# Patient Record
Sex: Male | Born: 1970
Health system: Southern US, Community
[De-identification: ages and names within clinical notes are randomized; demographics above are authoritative.]

## PROBLEM LIST (undated history)

## (undated) DIAGNOSIS — I499 Cardiac arrhythmia, unspecified: Secondary | ICD-10-CM

## (undated) DIAGNOSIS — K219 Gastro-esophageal reflux disease without esophagitis: Secondary | ICD-10-CM

## (undated) DIAGNOSIS — C439 Malignant melanoma of skin, unspecified: Secondary | ICD-10-CM

## (undated) HISTORY — DX: Malignant melanoma of skin, unspecified: C43.9

## (undated) HISTORY — DX: Gastro-esophageal reflux disease without esophagitis: K21.9

## (undated) HISTORY — PX: SHOULDER SURGERY: SHX246

---
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
## 2002-07-14 HISTORY — PX: SHOULDER SURGERY: SHX246

## 2004-12-23 ENCOUNTER — Ambulatory Visit: Payer: Self-pay

## 2006-10-15 IMAGING — CT CT HEAD WITHOUT CONTRAST
3 of 4 series · 17 of 30 positions shown, 19 images · non-contrast
Comparison: none

REASON FOR EXAM: pain S/P head injury   CALL REPORT TO [phone]
COMMENTS:

[Series 2: without · axial · non-contrast · 0.40mm/px · z∈[-54,+46]mm · 5 of 30 slices shown (1 of 2)]
[im 5/30  brain]
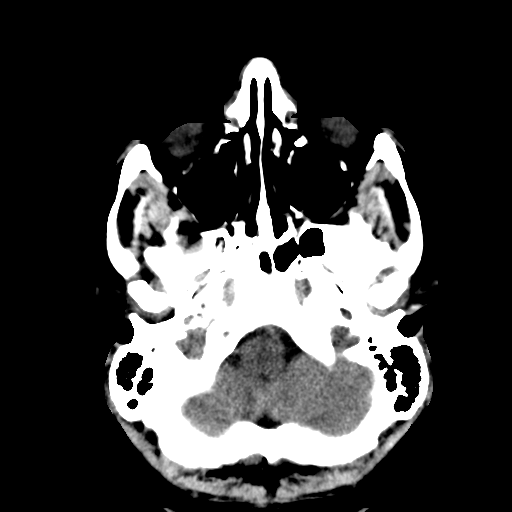
[im 10/30  brain]
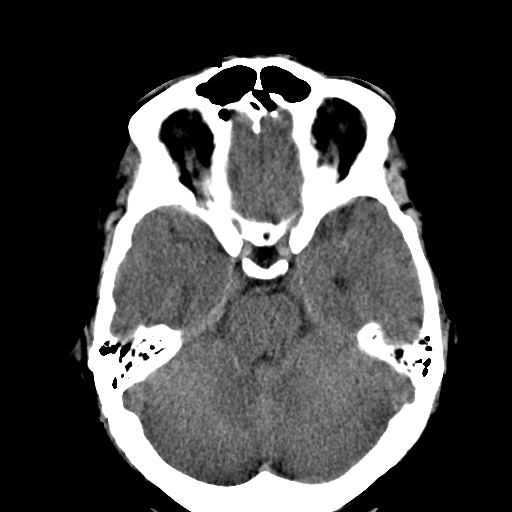
[im 15/30  brain]
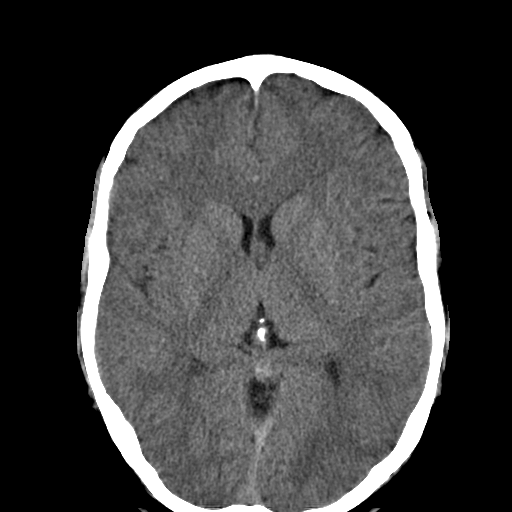
[im 20/30  brain]
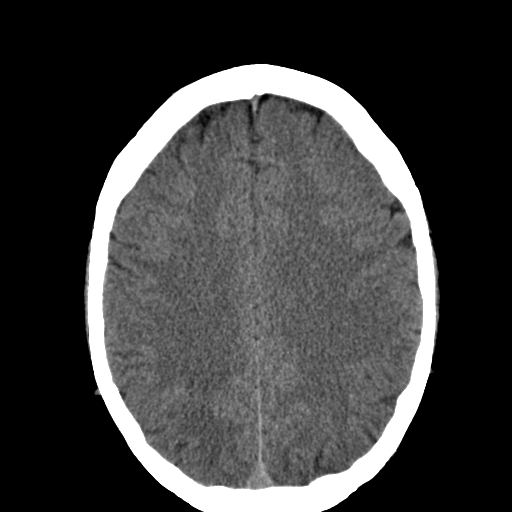
[im 25/30  brain]
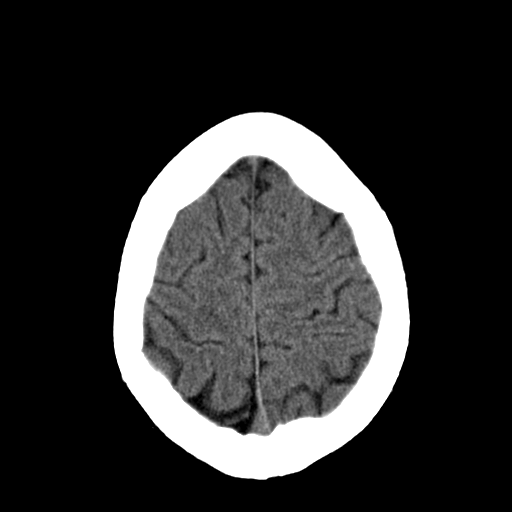

[Series 4: without · axial · non-contrast · 0.43mm/px · z∈[-54,+46]mm · 6 of 30 slices shown, 8 images (2 of 2)]
[im 5/30  brain]
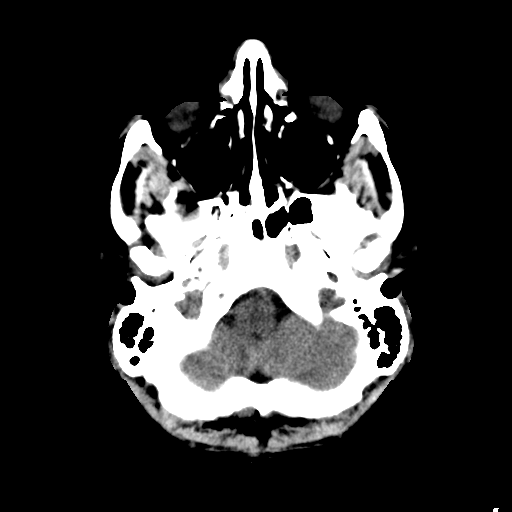
[im 5/30  bone]
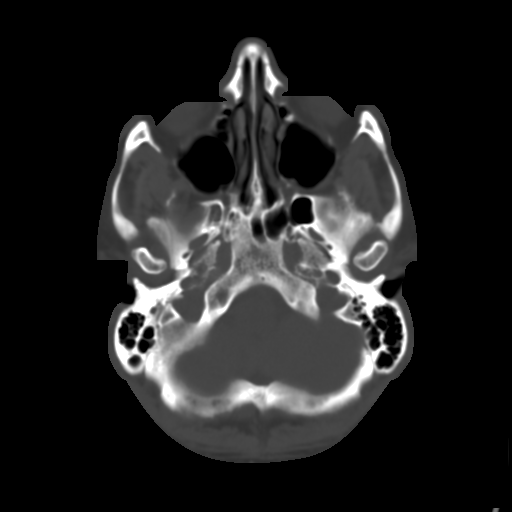
[im 9/30  brain]
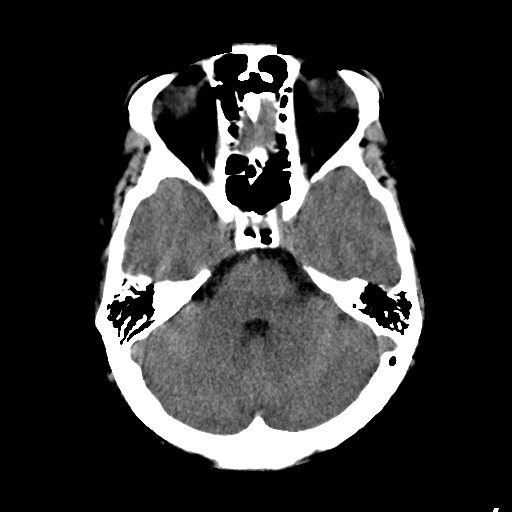
[im 13/30  brain]
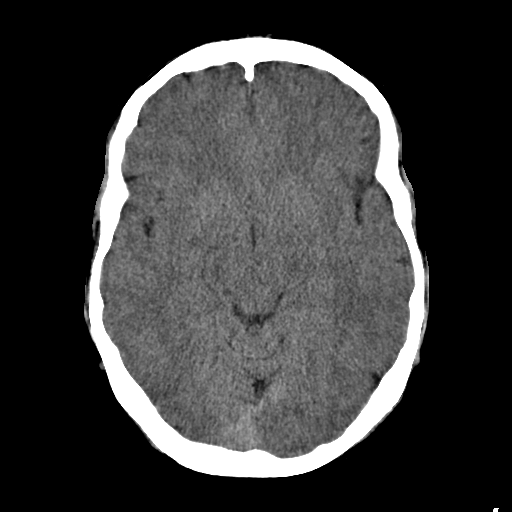
[im 17/30  brain]
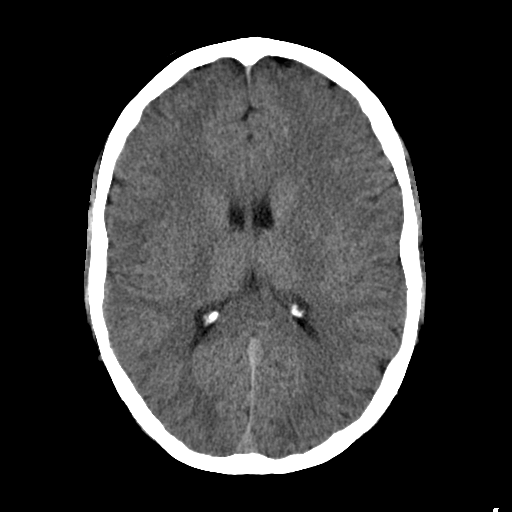
[im 21/30  brain]
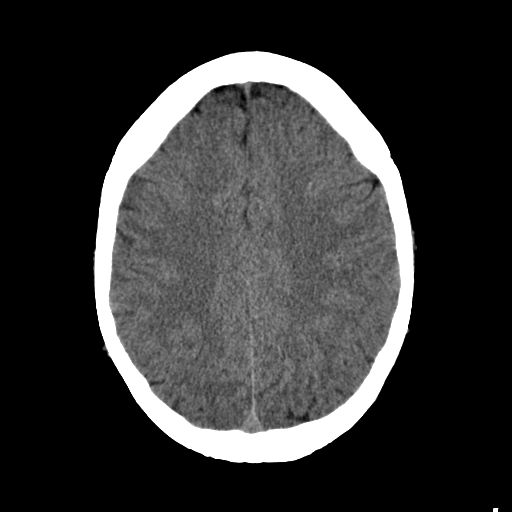
[im 21/30  bone]
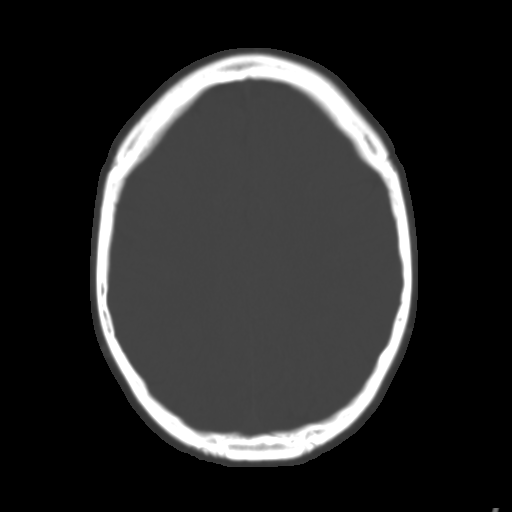
[im 25/30  brain]
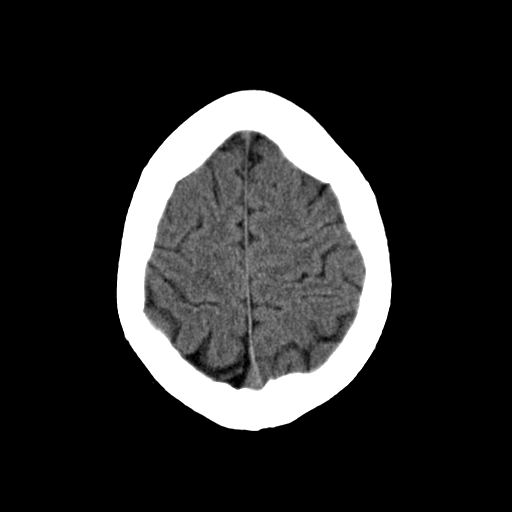

[Series 5: bone windows · axial · 0.44mm/px · z∈[-54,+46]mm · 6 of 30 slices shown]
[im 5/30  bone]
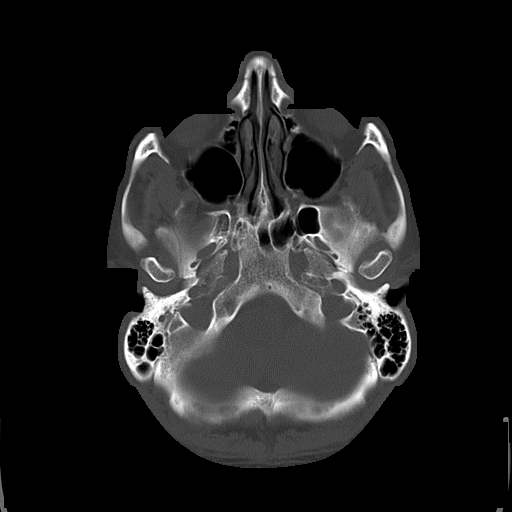
[im 9/30  bone]
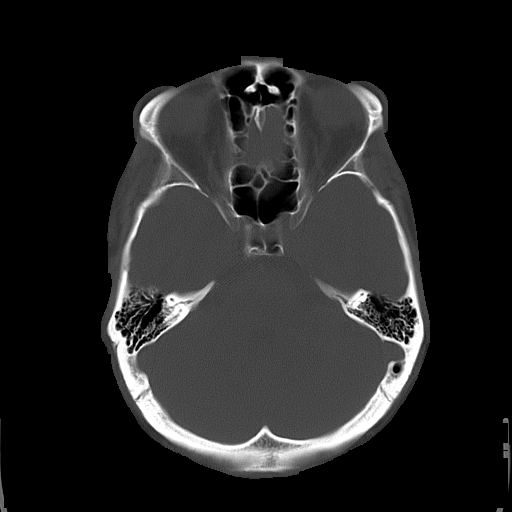
[im 13/30  bone]
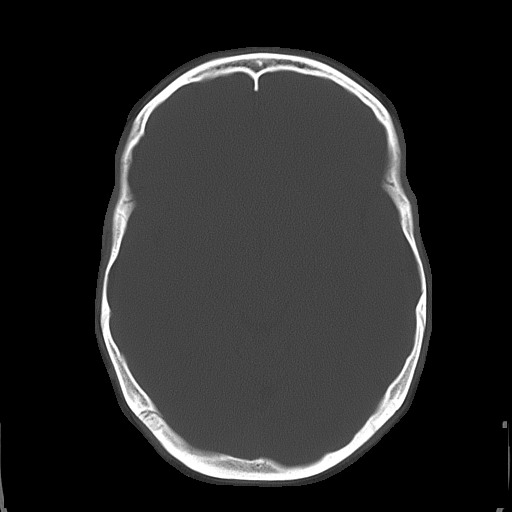
[im 17/30  bone]
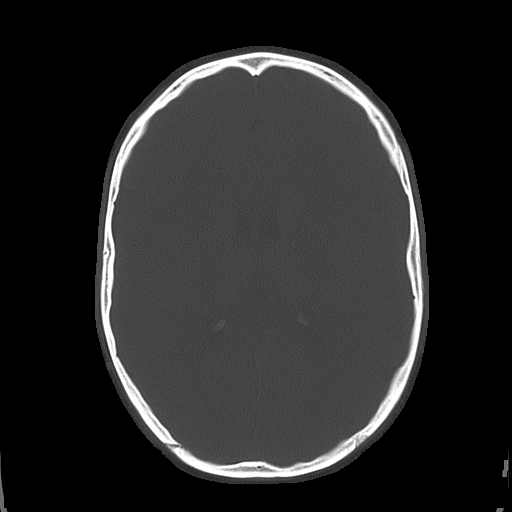
[im 21/30  bone]
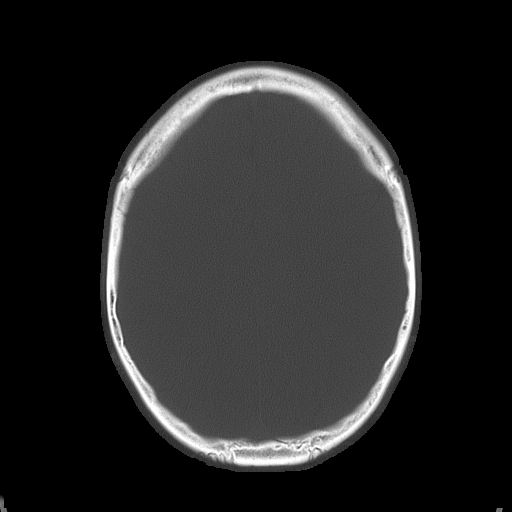
[im 25/30  bone]
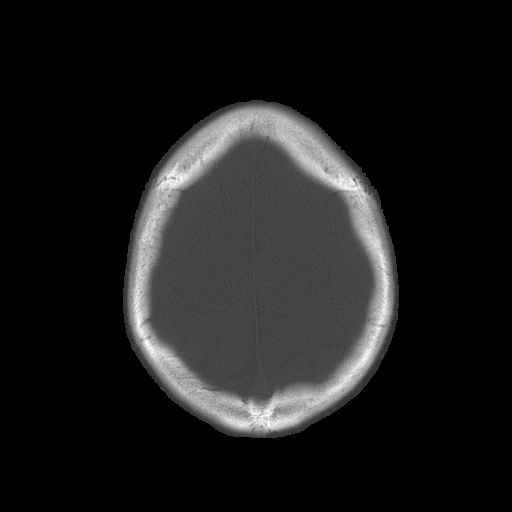

[17 of 30 positions shown; findings below may reference images not displayed]

PROCEDURE:     CT  - CT HEAD WITHOUT CONTRAST  - December 23, 2004 [DATE]

RESULT:       Noncontrast emergent CT scan of the brain shows the bone
windows are fully intact with no evidence of fracture.  There is an area of
low density in the posterior high RIGHT parietal region which suggest either
an area of encephalomalacia from previous injury or trauma or hypoxia.  A
migrational abnormality which may be an incidental finding could cause a
similar appearance. The brain, otherwise appears to be normal.   The
ventricles and sulci are maintained.  The visualized paranasal sinuses are
clear.
IMPRESSION: 1.     No acute intracranial abnormality.
2.     Possible incidental finding of a small area of encephalomalacia
versus minimal migration abnormality which is of doubtful clinical
significance.

## 2011-02-12 ENCOUNTER — Encounter: Payer: Self-pay | Admitting: Podiatry

## 2011-02-12 DIAGNOSIS — B351 Tinea unguium: Secondary | ICD-10-CM

## 2012-06-13 ENCOUNTER — Emergency Department: Payer: Self-pay | Admitting: Emergency Medicine

## 2013-04-22 ENCOUNTER — Ambulatory Visit (INDEPENDENT_AMBULATORY_CARE_PROVIDER_SITE_OTHER): Payer: BC Managed Care – PPO | Admitting: Family Medicine

## 2013-04-22 ENCOUNTER — Encounter: Payer: Self-pay | Admitting: Family Medicine

## 2013-04-22 VITALS — BP 132/96 | HR 76 | Temp 98.3°F | Resp 18 | Ht 71.5 in | Wt 244.0 lb

## 2013-04-22 DIAGNOSIS — Z23 Encounter for immunization: Secondary | ICD-10-CM

## 2013-04-22 DIAGNOSIS — M25519 Pain in unspecified shoulder: Secondary | ICD-10-CM

## 2013-04-22 DIAGNOSIS — IMO0001 Reserved for inherently not codable concepts without codable children: Secondary | ICD-10-CM

## 2013-04-22 DIAGNOSIS — K219 Gastro-esophageal reflux disease without esophagitis: Secondary | ICD-10-CM

## 2013-04-22 DIAGNOSIS — R030 Elevated blood-pressure reading, without diagnosis of hypertension: Secondary | ICD-10-CM

## 2013-04-22 DIAGNOSIS — M25512 Pain in left shoulder: Secondary | ICD-10-CM

## 2013-04-22 DIAGNOSIS — E669 Obesity, unspecified: Secondary | ICD-10-CM

## 2013-04-22 DIAGNOSIS — I10 Essential (primary) hypertension: Secondary | ICD-10-CM | POA: Insufficient documentation

## 2013-04-22 MED ORDER — NAPROXEN 500 MG PO TABS: 500.0000 mg | ORAL_TABLET | Freq: Two times a day (BID) | ORAL | Status: DC

## 2013-04-22 MED ORDER — OMEPRAZOLE 40 MG PO CPDR: 40.0000 mg | DELAYED_RELEASE_CAPSULE | Freq: Every day | ORAL | Status: DC

## 2013-04-22 NOTE — Progress Notes (Signed)
  Subjective:    Patient ID: Raymond Hansen, male    DOB: [birth year], 42 y.o.   MRN: [id number]  HPI  Pt here to establish care, previous PCP [facility] [place]- Dr. Dora Sims. Wants to transfer as this is closer to his home. Medications and history reviewed. History of recurrent bursitis in his right shoulder, had steroid injections in the past was seen by ortho with minimal improvement. He received ROM but now has symptoms in his Left shoulder that are the same as his right. He has sharp pain that radiates from shoulder to biceps region. He feels he has lost ROM. Has taken a few doses of ibuprofen and uses ICE. Worse after doing any type of yard work or repetitive movements.  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 , repetitive motions, heavy lifting in the past GERD- avoids specific foods that trigger symptoms, used prevacid, zantac in the past, now omeprazole OTC which helps   HAD CPE 6 months ago with previous PCP with fasting labs, states all labs were normal  Review of Systems  GEN- denies fatigue, fever, weight loss,weakness, recent illness HEENT- denies eye drainage, change in vision, nasal discharge, CVS- denies chest pain, palpitations RESP- denies SOB, cough, wheeze ABD- denies N/V, change in stools, abd pain GU- denies dysuria, hematuria, dribbling, incontinence MSK- + joint pain, muscle aches, injury Neuro- denies headache, dizziness, syncope, seizure activity      Objective:   Physical Exam GEN- NAD, alert and oriented x3, repeat BP 132/92  HEENT- PERRL, EOMI, non injected sclera, pink conjunctiva, MMM, oropharynx clear, TM clear bilat Neck- Supple, good ROM CVS- RRR, no murmur RESP-CTAB MSK- Normal inspection shoulder, arms, +empty can on Left side, Decreased ROM Left shoulder, biceps /triceps in tact, no winged scapula  Neuro- normal tone UE, strength decreased some L compared to RUE EXT- No edema Pulses- Radial 2+        Assessment &  Plan:

## 2013-04-22 NOTE — Assessment & Plan Note (Addendum)
Decreased ROM, no specific injury, ? Bursitis/ rotator cuff strain or early OA based on history occupations Refer back to ortho Start naprosyn BID

## 2013-04-22 NOTE — Assessment & Plan Note (Signed)
Diastolic BP was elevated a little today, even on recheck, will have him monitor this at home Discussed exercise and diet

## 2013-04-22 NOTE — Patient Instructions (Signed)
Referral to orthopedics for the shoulder FLu shot given Naprosyn twice a day  Omeprazole once a day for acid reflux Release of records from [facility] your blood pressure -  F/U 6 months

## 2013-04-22 NOTE — Assessment & Plan Note (Signed)
Increase omeprazole to 40mg  in setting of NSAID use for shoulder Obtain records and labs from her previous PCP

## 2013-07-14 DIAGNOSIS — C439 Malignant melanoma of skin, unspecified: Secondary | ICD-10-CM

## 2013-07-14 HISTORY — DX: Malignant melanoma of skin, unspecified: C43.9

## 2013-10-31 ENCOUNTER — Ambulatory Visit (INDEPENDENT_AMBULATORY_CARE_PROVIDER_SITE_OTHER): Payer: BC Managed Care – PPO | Admitting: Family Medicine

## 2013-10-31 ENCOUNTER — Encounter: Payer: Self-pay | Admitting: Family Medicine

## 2013-10-31 VITALS — BP 132/88 | HR 82 | Temp 98.3°F | Resp 16 | Ht 73.0 in | Wt 253.0 lb

## 2013-10-31 DIAGNOSIS — E669 Obesity, unspecified: Secondary | ICD-10-CM

## 2013-10-31 DIAGNOSIS — Z0000 Encounter for general adult medical examination without abnormal findings: Secondary | ICD-10-CM

## 2013-10-31 DIAGNOSIS — K219 Gastro-esophageal reflux disease without esophagitis: Secondary | ICD-10-CM

## 2013-10-31 LAB — COMPREHENSIVE METABOLIC PANEL
ALK PHOS: 48 U/L (ref 39–117)
ALT: 44 U/L (ref 0–53)
AST: 32 U/L (ref 0–37)
Albumin: 4.4 g/dL (ref 3.5–5.2)
BILIRUBIN TOTAL: 0.7 mg/dL (ref 0.2–1.2)
BUN: 9 mg/dL (ref 6–23)
CO2: 27 mEq/L (ref 19–32)
CREATININE: 0.85 mg/dL (ref 0.50–1.35)
Calcium: 9.3 mg/dL (ref 8.4–10.5)
Chloride: 102 mEq/L (ref 96–112)
GLUCOSE: 102 mg/dL — AB (ref 70–99)
Potassium: 3.9 mEq/L (ref 3.5–5.3)
Sodium: 140 mEq/L (ref 135–145)
Total Protein: 6.8 g/dL (ref 6.0–8.3)

## 2013-10-31 LAB — CBC WITH DIFFERENTIAL/PLATELET
Basophils Absolute: 0.1 10*3/uL (ref 0.0–0.1)
Basophils Relative: 1 % (ref 0–1)
EOS ABS: 0.3 10*3/uL (ref 0.0–0.7)
EOS PCT: 5 % (ref 0–5)
HEMATOCRIT: 43.7 % (ref 39.0–52.0)
HEMOGLOBIN: 16.1 g/dL (ref 13.0–17.0)
LYMPHS ABS: 1.5 10*3/uL (ref 0.7–4.0)
Lymphocytes Relative: 26 % (ref 12–46)
MCH: 32.3 pg (ref 26.0–34.0)
MCHC: 36.8 g/dL — ABNORMAL HIGH (ref 30.0–36.0)
MCV: 87.8 fL (ref 78.0–100.0)
MONO ABS: 0.6 10*3/uL (ref 0.1–1.0)
MONOS PCT: 10 % (ref 3–12)
NEUTROS PCT: 58 % (ref 43–77)
Neutro Abs: 3.3 10*3/uL (ref 1.7–7.7)
Platelets: 208 10*3/uL (ref 150–400)
RBC: 4.98 MIL/uL (ref 4.22–5.81)
RDW: 13.7 % (ref 11.5–15.5)
WBC: 5.7 10*3/uL (ref 4.0–10.5)

## 2013-10-31 LAB — LIPID PANEL
CHOL/HDL RATIO: 4.9 ratio
CHOLESTEROL: 201 mg/dL — AB (ref 0–200)
HDL: 41 mg/dL (ref >39)
LDL Cholesterol: 98 mg/dL (ref 0–99)
Triglycerides: 310 mg/dL — ABNORMAL HIGH (ref <150)
VLDL: 62 mg/dL — AB (ref 0–40)

## 2013-10-31 NOTE — Assessment & Plan Note (Addendum)
Check fasting labs discussed dietary changes and exercise routine short term goals set

## 2013-10-31 NOTE — Assessment & Plan Note (Signed)
Continue prilosec, symptoms well controlled with this

## 2013-10-31 NOTE — Assessment & Plan Note (Signed)
CPE done Fasting labs TDAP UTD

## 2013-10-31 NOTE — Patient Instructions (Signed)
I recommend eye visit once a year I recommend dental visit every 6 months Goal is to  Exercise 30 minutes 5 days a week We will send a letter with lab results  F/U 1 year for physical  

## 2013-10-31 NOTE — Progress Notes (Signed)
Patient ID: Raymond Hansen, male   DOB: 06/14/1971, 44 y.o.   MRN: [id number]   Subjective:    Patient ID: Raymond Hansen, male    DOB: 1971-01-15, 43 y.o.   MRN: [id number]  Patient presents for Annual Exam  patient here for complete physical. He has no particular concerns. Medications reviewed. He had his tetanus booster about 5 years ago. He has gained weight since her last visit secondary to change his dietary habits. He's planning to start exercising routine with his wife this week. Is no family history of any early prostate or colon cancer. Is due for fasting lab He does occasionally continue to get joint pain in his left shoulder. He was seen by orthopedics for this for bursitis and rotator cuff impingement he's had steroid injection which did not take very much. He's decided to hold off at this time and he occasionally uses Naprosyn    Review Of Systems:  GEN- denies fatigue, fever, weight loss,weakness, recent illness HEENT- denies eye drainage, change in vision, nasal discharge, CVS- denies chest pain, palpitations RESP- denies SOB, cough, wheeze ABD- denies N/V, change in stools, abd pain GU- denies dysuria, hematuria, dribbling, incontinence MSK- + joint pain, muscle aches, injury Neuro- denies headache, dizziness, syncope, seizure activity       Objective:    BP 132/88  Pulse 82  Temp(Src) 98.3 F (36.8 C) (Oral)  Resp 16  Ht 6\' 1"  (1.854 m)  Wt 253 lb (114.76 kg)  BMI 33.39 kg/m2 GEN- NAD, alert and oriented x3 HEENT- PERRL, EOMI, non injected sclera, pink conjunctiva, MMM, oropharynx clear Neck- Supple, no thyromegaly CVS- RRR, no murmur RESP-CTAB ABD-NABS,soft,NT,ND EXT- No edema Pulses- Radial, DP- 2+        Assessment & Plan:      Problem List Items Addressed This Visit   Routine general medical examination at a health care facility - Primary   Relevant Orders      Comprehensive metabolic panel      CBC with Differential      Lipid panel    Obesity, unspecified     Check fasting labs discussed     Relevant Orders      Lipid panel      Note: This dictation was prepared with Dragon dictation along with smaller phrase technology. Any transcriptional errors that result from this process are unintentional.

## 2013-11-01 ENCOUNTER — Other Ambulatory Visit: Payer: Self-pay | Admitting: *Deleted

## 2013-11-01 DIAGNOSIS — E785 Hyperlipidemia, unspecified: Secondary | ICD-10-CM

## 2013-11-01 MED ORDER — FISH OIL 1000 MG PO CAPS: 1.0000 | ORAL_CAPSULE | Freq: Two times a day (BID) | ORAL | Status: DC

## 2013-11-08 ENCOUNTER — Other Ambulatory Visit: Payer: Self-pay | Admitting: Family Medicine

## 2013-11-08 DIAGNOSIS — K219 Gastro-esophageal reflux disease without esophagitis: Secondary | ICD-10-CM

## 2013-11-08 NOTE — Telephone Encounter (Signed)
Medication refilled per protocol. 

## 2014-02-21 ENCOUNTER — Encounter: Payer: Self-pay | Admitting: Family Medicine

## 2014-02-21 ENCOUNTER — Ambulatory Visit (INDEPENDENT_AMBULATORY_CARE_PROVIDER_SITE_OTHER): Payer: BC Managed Care – PPO | Admitting: Family Medicine

## 2014-02-21 VITALS — BP 134/80 | HR 72 | Temp 98.5°F | Resp 14 | Ht 72.0 in | Wt 248.0 lb

## 2014-02-21 DIAGNOSIS — E781 Pure hyperglyceridemia: Secondary | ICD-10-CM

## 2014-02-21 LAB — LIPID PANEL
CHOLESTEROL: 213 mg/dL — AB (ref 0–200)
HDL: 43 mg/dL (ref >39)
TRIGLYCERIDES: 403 mg/dL — AB (ref <150)
Total CHOL/HDL Ratio: 5 Ratio

## 2014-02-21 NOTE — Progress Notes (Signed)
Patient ID: Raymond Hansen, male   DOB: 1971/05/04, 43 y.o.   MRN: [id number]   Subjective:    Patient ID: Raymond Hansen, male    DOB: [birth year], 43 y.o.   MRN: [id number]  Patient presents for F/U  patient here for repeat lipid panel. He did not need office visit for this. History is due for lab. He will not be charged for this visit         Objective:    BP 134/80  Pulse 72  Temp(Src) 98.5 F (36.9 C) (Oral)  Resp 14  Ht 6' (1.829 m)  Wt 248 lb (112.492 kg)  BMI 33.63 kg/m2 GEN- NAD, alert and oriented x3         Assessment & Plan:      Problem List Items Addressed This Visit   Hypertriglyceridemia - Primary   Relevant Orders      Lipid panel      Note: This dictation was prepared with Dragon dictation along with smaller phrase technology. Any transcriptional errors that result from this process are unintentional.

## 2014-02-21 NOTE — Patient Instructions (Signed)
F/U as needed

## 2014-02-23 ENCOUNTER — Other Ambulatory Visit: Payer: Self-pay | Admitting: *Deleted

## 2014-02-23 MED ORDER — FENOFIBRATE 145 MG PO TABS
145.0000 mg | ORAL_TABLET | Freq: Every day | ORAL | Status: DC
Start: 2014-02-23 — End: 2015-02-02

## 2014-11-09 ENCOUNTER — Other Ambulatory Visit: Payer: Self-pay | Admitting: Family Medicine

## 2014-11-09 NOTE — Telephone Encounter (Signed)
Refill appropriate and filled per protocol. 

## 2014-12-01 ENCOUNTER — Encounter: Payer: BC Managed Care – PPO | Admitting: Family Medicine

## 2014-12-05 ENCOUNTER — Telehealth: Payer: Self-pay | Admitting: *Deleted

## 2014-12-05 ENCOUNTER — Telehealth: Payer: Self-pay | Admitting: Family Medicine

## 2014-12-05 MED ORDER — NAPROXEN 500 MG PO TABS: 500.0000 mg | ORAL_TABLET | Freq: Two times a day (BID) | ORAL | Status: DC

## 2014-12-05 NOTE — Telephone Encounter (Signed)
Refill appropriate and filled per protocol. 

## 2014-12-05 NOTE — Telephone Encounter (Signed)
Patient wife calling for refill of naproxen because patient having inflammation foot [phone]

## 2014-12-05 NOTE — Telephone Encounter (Signed)
Pt's wife, states the earliest appt for this pt is in June, and she was wondering if there was anything her husband could take other than Ibuprofen.  I informed pt's wife, if pt could tolerate Tylenol it could be taken as package directs in between doses of Ibuprofen for additional pain coverage.  I also reviewed pt's medication list and the pt had been given Naprosyn by Dr. Kevan Rosebush, so if the pt could tolerate Naprosyn, may choose to take Naprosyn or Ibuprofen, but not both.  Pt's wife states understanding.

## 2014-12-06 ENCOUNTER — Encounter: Payer: Self-pay | Admitting: Podiatry

## 2014-12-06 ENCOUNTER — Telehealth: Payer: Self-pay | Admitting: *Deleted

## 2014-12-06 ENCOUNTER — Ambulatory Visit (INDEPENDENT_AMBULATORY_CARE_PROVIDER_SITE_OTHER): Payer: BLUE CROSS/BLUE SHIELD

## 2014-12-06 ENCOUNTER — Ambulatory Visit (INDEPENDENT_AMBULATORY_CARE_PROVIDER_SITE_OTHER): Payer: BLUE CROSS/BLUE SHIELD | Admitting: Podiatry

## 2014-12-06 ENCOUNTER — Other Ambulatory Visit: Payer: Self-pay | Admitting: Podiatry

## 2014-12-06 VITALS — BP 122/84 | HR 83 | Resp 18

## 2014-12-06 DIAGNOSIS — R52 Pain, unspecified: Secondary | ICD-10-CM

## 2014-12-06 DIAGNOSIS — M722 Plantar fascial fibromatosis: Secondary | ICD-10-CM | POA: Diagnosis not present

## 2014-12-06 MED ORDER — MELOXICAM 15 MG PO TABS: 15.0000 mg | ORAL_TABLET | Freq: Every day | ORAL | Status: DC

## 2014-12-06 NOTE — Progress Notes (Signed)
   Subjective:    Patient ID: Raymond Hansen, male    DOB: 1971-06-26, 44 y.o.   MRN: [id number]  HPI  44 year old male presents the office today with complaints of right heel pain and pain involving his feet which has been ongoing for approximate one week. He states that it all started after mowing the [place] last week. He states the majority the pain is in the heel as well as the ball the foot. He denies any swelling or redness overlying the area. He denies any numbness or tingling. The pain does not wake him up at night. He doesn't have increased pain in the morning which is relieved by ambulation however there is pain after being on them for quite some time. He said no prior treatment. No other complaints at this time.    Review of Systems  Musculoskeletal: Positive for gait problem.       JOINT PAIN  All other systems reviewed and are negative.      Objective:   Physical Exam AAO x3, NAD DP/PT pulses palpable bilaterally, CRT less than 3 seconds Protective sensation intact with Simms Weinstein monofilament, vibratory sensation intact, Achilles tendon reflex intact Tenderness to palpation overlying the plantar medial tubercle of the calcaneus to the right heel at the insertion of the plantar fascia. There is no pain along the course of plantar fascial within the arch of the foot. The plantar fascia appears intact. There is no pain with lateral compression of the calcaneus or pain the vibratory sensation. No pain on the posterior aspect of the calcaneus or along the course/insertion of the Achilles tendon. This is mild diffuse tenderness over submetatarsal area on the right foot. There is no specific pinpoint bony tenderness to this area and there is no pain with MPTJ ROM. There is no overlying edema, erythema, increase in warmth. No other areas of tenderness palpation or pain with vibratory sensation to the foot/ankle. MMT 5/5, ROM WNL No open lesions or pre-ulcerative lesions are  identified. No pain with calf compression, swelling, warmth, erythema.     Assessment & Plan:  44 year old male with right foot pain, likely plantar fasciitis/metatarsalgia possibly from compensation -Treatment options discussed including all alternatives, risks, and complications -X-rays were obtained and reviewed with the patient.  -Patient elects to proceed with steroid injection into the right heel. Under sterile skin preparation, a total of 2.5cc of kenalog 10, 0.5% Marcaine plain, and 2% lidocaine plain were infiltrated into the symptomatic area without complication. A band-aid was applied. Patient tolerated the injection well without complication. Post-injection care with discussed with the patient. Discussed with the patient to ice the area over the next couple of days to help prevent a steroid flare.  -Dispensed plantar fascial brace -Prescribed meloxicam. He states he is previously taken anti-inflammatories without any problem. Discussed with him that if the GERD worsens to stop the medication.  -Ice to the area -Discussed stretching exercises -Discussed shoe gear modifications and not to go barefoot even while at home. -Discussed possible orthotics -Follow-up in 3 weeks or sooner if any problems are to arise. In the meantime, encouraged call the office with any questions, concerns, change in symptoms.

## 2014-12-06 NOTE — Patient Instructions (Signed)
Plantar Fasciitis (Heel Spur Syndrome) with Rehab The plantar fascia is a fibrous, ligament-like, soft-tissue structure that spans the bottom of the foot. Plantar fasciitis is a condition that causes pain in the foot due to inflammation of the tissue. SYMPTOMS   Pain and tenderness on the underneath side of the foot.  Pain that worsens with standing or walking. CAUSES  Plantar fasciitis is caused by irritation and injury to the plantar fascia on the underneath side of the foot. Common mechanisms of injury include:  Direct trauma to bottom of the foot.  Damage to a small nerve that runs under the foot where the main fascia attaches to the heel bone.  Stress placed on the plantar fascia due to bone spurs. RISK INCREASES WITH:   Activities that place stress on the plantar fascia (running, jumping, pivoting, or cutting).  Poor strength and flexibility.  Improperly fitted shoes.  Tight calf muscles.  Flat feet.  Failure to warm-up properly before activity.  Obesity. PREVENTION  Warm up and stretch properly before activity.  Allow for adequate recovery between workouts.  Maintain physical fitness:  Strength, flexibility, and endurance.  Cardiovascular fitness.  Maintain a health body weight.  Avoid stress on the plantar fascia.  Wear properly fitted shoes, including arch supports for individuals who have flat feet. PROGNOSIS  If treated properly, then the symptoms of plantar fasciitis usually resolve without surgery. However, occasionally surgery is necessary. RELATED COMPLICATIONS   Recurrent symptoms that may result in a chronic condition.  Problems of the lower back that are caused by compensating for the injury, such as limping.  Pain or weakness of the foot during push-off following surgery.  Chronic inflammation, scarring, and partial or complete fascia tear, occurring more often from repeated injections. TREATMENT  Treatment initially involves the use of  ice and medication to help reduce pain and inflammation. The use of strengthening and stretching exercises may help reduce pain with activity, especially stretches of the Achilles tendon. These exercises may be performed at home or with a therapist. Your caregiver may recommend that you use heel cups of arch supports to help reduce stress on the plantar fascia. Occasionally, corticosteroid injections are given to reduce inflammation. If symptoms persist for greater than 6 months despite non-surgical (conservative), then surgery may be recommended.  MEDICATION   If pain medication is necessary, then nonsteroidal anti-inflammatory medications, such as aspirin and ibuprofen, or other minor pain relievers, such as acetaminophen, are often recommended.  Do not take pain medication within 7 days before surgery.  Prescription pain relievers may be given if deemed necessary by your caregiver. Use only as directed and only as much as you need.  Corticosteroid injections may be given by your caregiver. These injections should be reserved for the most serious cases, because they may only be given a certain number of times. HEAT AND COLD  Cold treatment (icing) relieves pain and reduces inflammation. Cold treatment should be applied for 10 to 15 minutes every 2 to 3 hours for inflammation and pain and immediately after any activity that aggravates your symptoms. Use ice packs or massage the area with a piece of ice (ice massage).  Heat treatment may be used prior to performing the stretching and strengthening activities prescribed by your caregiver, physical therapist, or athletic trainer. Use a heat pack or soak the injury in warm water. SEEK IMMEDIATE MEDICAL CARE IF:  Treatment seems to offer no benefit, or the condition worsens.  Any medications produce adverse side effects. EXERCISES RANGE   OF MOTION (ROM) AND STRETCHING EXERCISES - Plantar Fasciitis (Heel Spur Syndrome) These exercises may help you  when beginning to rehabilitate your injury. Your symptoms may resolve with or without further involvement from your physician, physical therapist or athletic trainer. While completing these exercises, remember:   Restoring tissue flexibility helps normal motion to return to the joints. This allows healthier, less painful movement and activity.  An effective stretch should be held for at least 30 seconds.  A stretch should never be painful. You should only feel a gentle lengthening or release in the stretched tissue. RANGE OF MOTION - Toe Extension, Flexion  Sit with your right / left leg crossed over your opposite knee.  Grasp your toes and gently pull them back toward the top of your foot. You should feel a stretch on the bottom of your toes and/or foot.  Hold this stretch for __________ seconds.  Now, gently pull your toes toward the bottom of your foot. You should feel a stretch on the top of your toes and or foot.  Hold this stretch for __________ seconds. Repeat __________ times. Complete this stretch __________ times per day.  RANGE OF MOTION - Ankle Dorsiflexion, Active Assisted  Remove shoes and sit on a chair that is preferably not on a carpeted surface.  Place right / left foot under knee. Extend your opposite leg for support.  Keeping your heel down, slide your right / left foot back toward the chair until you feel a stretch at your ankle or calf. If you do not feel a stretch, slide your bottom forward to the edge of the chair, while still keeping your heel down.  Hold this stretch for __________ seconds. Repeat __________ times. Complete this stretch __________ times per day.  STRETCH - Gastroc, Standing  Place hands on wall.  Extend right / left leg, keeping the front knee somewhat bent.  Slightly point your toes inward on your back foot.  Keeping your right / left heel on the floor and your knee straight, shift your weight toward the wall, not allowing your back to  arch.  You should feel a gentle stretch in the right / left calf. Hold this position for __________ seconds. Repeat __________ times. Complete this stretch __________ times per day. STRETCH - Soleus, Standing  Place hands on wall.  Extend right / left leg, keeping the other knee somewhat bent.  Slightly point your toes inward on your back foot.  Keep your right / left heel on the floor, bend your back knee, and slightly shift your weight over the back leg so that you feel a gentle stretch deep in your back calf.  Hold this position for __________ seconds. Repeat __________ times. Complete this stretch __________ times per day. STRETCH - Gastrocsoleus, Standing  Note: This exercise can place a lot of stress on your foot and ankle. Please complete this exercise only if specifically instructed by your caregiver.   Place the ball of your right / left foot on a step, keeping your other foot firmly on the same step.  Hold on to the wall or a rail for balance.  Slowly lift your other foot, allowing your body weight to press your heel down over the edge of the step.  You should feel a stretch in your right / left calf.  Hold this position for __________ seconds.  Repeat this exercise with a slight bend in your right / left knee. Repeat __________ times. Complete this stretch __________ times per day.    STRENGTHENING EXERCISES - Plantar Fasciitis (Heel Spur Syndrome)  These exercises may help you when beginning to rehabilitate your injury. They may resolve your symptoms with or without further involvement from your physician, physical therapist or athletic trainer. While completing these exercises, remember:   Muscles can gain both the endurance and the strength needed for everyday activities through controlled exercises.  Complete these exercises as instructed by your physician, physical therapist or athletic trainer. Progress the resistance and repetitions only as guided. STRENGTH -  Towel Curls  Sit in a chair positioned on a non-carpeted surface.  Place your foot on a towel, keeping your heel on the floor.  Pull the towel toward your heel by only curling your toes. Keep your heel on the floor.  If instructed by your physician, physical therapist or athletic trainer, add ____________________ at the end of the towel. Repeat __________ times. Complete this exercise __________ times per day. STRENGTH - Ankle Inversion  Secure one end of a rubber exercise band/tubing to a fixed object (table, pole). Loop the other end around your foot just before your toes.  Place your fists between your knees. This will focus your strengthening at your ankle.  Slowly, pull your big toe up and in, making sure the band/tubing is positioned to resist the entire motion.  Hold this position for __________ seconds.  Have your muscles resist the band/tubing as it slowly pulls your foot back to the starting position. Repeat __________ times. Complete this exercises __________ times per day.  Document Released: 06/30/2005 Document Revised: 09/22/2011 Document Reviewed: 10/12/2008 [employer] Patient Information 2015 [employer]. This information is not intended to replace advice given to you by your health care provider. Make sure you discuss any questions you have with your health care provider.  

## 2014-12-06 NOTE — Telephone Encounter (Signed)
[place] states pt was prescribed Mobic by Dr. Jacqualyn Posey today and Dr. Buelah Manis prescribed Naprosyn yesterday, which medication is the pt to take?  I told Raymond Hansen pt was seen in office today by Dr. Jacqualyn Posey so pt is to stop the Naprosyn and begin the Mobic.  Raymond Hansen states he will only fill the Mobic and tell pt he is to stop the Naprosyn.

## 2014-12-18 ENCOUNTER — Ambulatory Visit: Payer: Self-pay | Admitting: Podiatry

## 2014-12-29 ENCOUNTER — Ambulatory Visit (INDEPENDENT_AMBULATORY_CARE_PROVIDER_SITE_OTHER): Payer: BLUE CROSS/BLUE SHIELD | Admitting: Podiatry

## 2014-12-29 ENCOUNTER — Encounter: Payer: Self-pay | Admitting: Podiatry

## 2014-12-29 VITALS — BP 138/90 | HR 74 | Resp 12

## 2014-12-29 DIAGNOSIS — M722 Plantar fascial fibromatosis: Secondary | ICD-10-CM

## 2015-01-05 NOTE — Progress Notes (Signed)
Patient ID: Raymond Hansen, male   DOB: August 19, 1970, 44 y.o.   MRN: [id number]  Subjective: 44 year old male presents the office they for follow-up evaluation of right foot pain, plantar fasciitis. He states that he still continues to have some discomfort to the heel and the arch of the foot although it is improved some. He is inquiring a possible orthotics. He is continuing stretching icing activities as well as a plantar fascial brace. He has been very active recently working in the yard and doing other activities. No recent injury or trauma. No other complaints this time in no acute changes since last appointment.  Objective: AAO x3, NAD DP/PT pulses palpable bilaterally, CRT less than 3 seconds Protective sensation intact with Simms Weinstein monofilament, vibratory sensation intact, Achilles tendon reflex intact Tenderness to palpation overlying the plantar medial tubercle of the calcaneus to the right heel at the insertion of the plantar fascia, although it is decreased. The plantar fascia appears intact. There is no pain along the course of plantar fascial within the arch of the foot. There is no pain with lateral compression of the calcaneus or pain the vibratory sensation. No pain on the posterior aspect of the calcaneus or along the course/insertion of the Achilles tendon. There is no overlying edema, erythema, increase in warmth. No other areas of tenderness palpation or pain with vibratory sensation to the foot/ankle. MMT 5/5, ROM WNL No open lesions or pre-ulcerative lesions are identified. No pain with calf compression, swelling, warmth, erythema.  Assessment: 44 year old male with right heel pain, plantar fasciitis  Plan: -Treatment options discussed including all alternatives, risks, and complications -Patient was is hold off on any further steroid injection. -He will to proceed with orthotics this time. He was scanned for orthotics made were sent to [facility]. -Continue  stretching, icing activities consistent basis. Also continue plantar fascial brace. -Anti-inflammatories as needed -Shoegear modification -Follow-up after orthotics arrive or sooner if any problems arise. In the meantime, encouraged to call the office with any questions, concerns, change in symptoms.

## 2015-01-19 ENCOUNTER — Other Ambulatory Visit: Payer: Self-pay | Admitting: *Deleted

## 2015-01-19 ENCOUNTER — Encounter: Payer: Self-pay | Admitting: Family Medicine

## 2015-01-19 ENCOUNTER — Ambulatory Visit: Payer: BLUE CROSS/BLUE SHIELD | Admitting: *Deleted

## 2015-01-19 DIAGNOSIS — E781 Pure hyperglyceridemia: Secondary | ICD-10-CM

## 2015-01-19 DIAGNOSIS — M722 Plantar fascial fibromatosis: Secondary | ICD-10-CM

## 2015-01-19 DIAGNOSIS — I10 Essential (primary) hypertension: Secondary | ICD-10-CM

## 2015-01-19 DIAGNOSIS — Z0000 Encounter for general adult medical examination without abnormal findings: Secondary | ICD-10-CM

## 2015-01-19 NOTE — Patient Instructions (Signed)

## 2015-01-19 NOTE — Progress Notes (Signed)
Patient ID: Raymond Hansen, male   DOB: 1971-01-12, 44 y.o.   MRN: [id number] Patient presents for orthotic pick up.  Verbal and written break in and wear instructions are given.  Patient will follow up in 4 weeks if symptoms worsen or fail to improve.

## 2015-01-26 ENCOUNTER — Other Ambulatory Visit: Payer: BLUE CROSS/BLUE SHIELD

## 2015-01-26 DIAGNOSIS — E781 Pure hyperglyceridemia: Secondary | ICD-10-CM

## 2015-01-26 DIAGNOSIS — Z0000 Encounter for general adult medical examination without abnormal findings: Secondary | ICD-10-CM

## 2015-01-26 DIAGNOSIS — I10 Essential (primary) hypertension: Secondary | ICD-10-CM

## 2015-01-26 LAB — CBC WITH DIFFERENTIAL/PLATELET
Basophils Absolute: 0.1 10*3/uL (ref 0.0–0.1)
Basophils Relative: 1 % (ref 0–1)
Eosinophils Absolute: 0.2 10*3/uL (ref 0.0–0.7)
Eosinophils Relative: 4 % (ref 0–5)
HEMATOCRIT: 45.6 % (ref 39.0–52.0)
HEMOGLOBIN: 15.8 g/dL (ref 13.0–17.0)
Lymphocytes Relative: 27 % (ref 12–46)
Lymphs Abs: 1.6 10*3/uL (ref 0.7–4.0)
MCH: 32 pg (ref 26.0–34.0)
MCHC: 34.6 g/dL (ref 30.0–36.0)
MCV: 92.5 fL (ref 78.0–100.0)
MONOS PCT: 9 % (ref 3–12)
MPV: 9 fL (ref 8.6–12.4)
Monocytes Absolute: 0.5 10*3/uL (ref 0.1–1.0)
Neutro Abs: 3.5 10*3/uL (ref 1.7–7.7)
Neutrophils Relative %: 59 % (ref 43–77)
PLATELETS: 212 10*3/uL (ref 150–400)
RBC: 4.93 MIL/uL (ref 4.22–5.81)
RDW: 13.6 % (ref 11.5–15.5)
WBC: 5.9 10*3/uL (ref 4.0–10.5)

## 2015-01-26 LAB — COMPLETE METABOLIC PANEL WITH GFR
ALT: 57 U/L — ABNORMAL HIGH (ref 0–53)
AST: 37 U/L (ref 0–37)
Albumin: 4.3 g/dL (ref 3.5–5.2)
Alkaline Phosphatase: 43 U/L (ref 39–117)
BUN: 10 mg/dL (ref 6–23)
CALCIUM: 9.2 mg/dL (ref 8.4–10.5)
CO2: 26 mEq/L (ref 19–32)
CREATININE: 0.82 mg/dL (ref 0.50–1.35)
Chloride: 105 mEq/L (ref 96–112)
Glucose, Bld: 105 mg/dL — ABNORMAL HIGH (ref 70–99)
Potassium: 3.9 mEq/L (ref 3.5–5.3)
SODIUM: 142 meq/L (ref 135–145)
TOTAL PROTEIN: 6.6 g/dL (ref 6.0–8.3)
Total Bilirubin: 0.7 mg/dL (ref 0.2–1.2)

## 2015-01-26 LAB — LIPID PANEL
CHOLESTEROL: 194 mg/dL (ref 0–200)
HDL: 36 mg/dL — ABNORMAL LOW (ref >=40)
LDL Cholesterol: 92 mg/dL (ref 0–99)
Total CHOL/HDL Ratio: 5.4 Ratio
Triglycerides: 329 mg/dL — ABNORMAL HIGH (ref <150)
VLDL: 66 mg/dL — ABNORMAL HIGH (ref 0–40)

## 2015-02-02 ENCOUNTER — Encounter: Payer: Self-pay | Admitting: Family Medicine

## 2015-02-02 ENCOUNTER — Ambulatory Visit (INDEPENDENT_AMBULATORY_CARE_PROVIDER_SITE_OTHER): Payer: BLUE CROSS/BLUE SHIELD | Admitting: Family Medicine

## 2015-02-02 VITALS — BP 126/62 | HR 70 | Temp 98.3°F | Resp 14 | Ht 72.0 in | Wt 245.0 lb

## 2015-02-02 DIAGNOSIS — Z0000 Encounter for general adult medical examination without abnormal findings: Secondary | ICD-10-CM | POA: Diagnosis not present

## 2015-02-02 DIAGNOSIS — K219 Gastro-esophageal reflux disease without esophagitis: Secondary | ICD-10-CM

## 2015-02-02 DIAGNOSIS — E781 Pure hyperglyceridemia: Secondary | ICD-10-CM | POA: Diagnosis not present

## 2015-02-02 DIAGNOSIS — E669 Obesity, unspecified: Secondary | ICD-10-CM

## 2015-02-02 NOTE — Assessment & Plan Note (Signed)
Cholesterol has improved, TG improved but still needs to drop 200 points. This is due to diet and lack of exercise As unable to tolerate tricor and not actually taking the Omega 3, will place him on Omega 3 - 2000mg  once a day

## 2015-02-02 NOTE — Assessment & Plan Note (Signed)
Discussed low fat, low carb diet, glucose up minimally, TG

## 2015-02-02 NOTE — Assessment & Plan Note (Signed)
Stable on prilosec

## 2015-02-02 NOTE — Assessment & Plan Note (Signed)
CPE done, immunizations UTD, fasting labs reviewed.  Regarding eye change, likley due to fatigue, advised to schedule eye appt if not improved

## 2015-02-02 NOTE — Progress Notes (Signed)
Patient ID: Raymond Hansen, male   DOB: [birth year], 44 y.o.   MRN: [id number]   Subjective:    Patient ID: Raymond Hansen, male    DOB: 02-18-1971, 44 y.o.   MRN: [id number]  Patient presents for CPE  Pt here for CPE, doing well overall. Had 3 areas of Melanoma removed by [facility]. Also being seen by podiatry for plantar fasciitis, has inserts in his shoes which are helping.  Hypertriglyceridemia- he has not been monitoring his diet, or taking fish oil, however TG are down 75 points, he stopped Tricor as it caused muscle aches he felt like he had the flu all the time. UTD- immunizations, followed by dentitist He had 1 episode after not having much sleep, where he saw a flasher in his right eye, last a second, has chronic floaters and has seen eye doctor for this,has glasses but does not wear them as prescribed. He did not lose any vision, no HA associated, has not had any episodes since then    Review Of Systems:  GEN- denies fatigue, fever, weight loss,weakness, recent illness HEENT- denies eye drainage, +change in vision, nasal discharge, CVS- denies chest pain, palpitations RESP- denies SOB, cough, wheeze ABD- denies N/V, change in stools, abd pain GU- denies dysuria, hematuria, dribbling, incontinence MSK- denies joint pain, muscle aches, injury Neuro- denies headache, dizziness, syncope, seizure activity       Objective:    BP 126/62 mmHg  Pulse 70  Temp(Src) 98.3 F (36.8 C) (Oral)  Resp 14  Ht 6' (1.829 m)  Wt 245 lb (111.131 kg)  BMI 33.22 kg/m2 GEN- NAD, alert and oriented x3 HEENT- PERRL, EOMI, non injected sclera, pink conjunctiva, MMM, oropharynx clear,fundus benign appearing, TM clear, canals mild soft wax on left side, right canal clear  Neck- Supple, no thyromegaly CVS- RRR, no murmur RESP-CTAB ABD-NABS,soft,NT,ND, no HSM  EXT- No edema Pulses- Radial, 2+        Assessment & Plan:      Problem List Items Addressed This Visit    Routine general medical examination at a health care facility - Primary   Obesity   Hypertriglyceridemia   GERD (gastroesophageal reflux disease)      Note: This dictation was prepared with Dragon dictation along with smaller phrase technology. Any transcriptional errors that result from this process are unintentional.

## 2015-02-02 NOTE — Patient Instructions (Signed)
I recommend eye visit -please schedule  I recommend dental visit every 6 months Goal is to  Exercise 30 minutes 5 days a week Restart fish oil 1000mg  twice a day, low fat diet  F/U for lab visit in 3 months F/U 1 year for Physical

## 2015-04-12 ENCOUNTER — Ambulatory Visit (INDEPENDENT_AMBULATORY_CARE_PROVIDER_SITE_OTHER): Payer: BLUE CROSS/BLUE SHIELD | Admitting: Family Medicine

## 2015-04-12 ENCOUNTER — Encounter: Payer: Self-pay | Admitting: Family Medicine

## 2015-04-12 VITALS — BP 118/90 | HR 78 | Temp 98.3°F | Resp 16 | Ht 73.0 in | Wt 247.0 lb

## 2015-04-12 DIAGNOSIS — J019 Acute sinusitis, unspecified: Secondary | ICD-10-CM

## 2015-04-12 DIAGNOSIS — R51 Headache: Secondary | ICD-10-CM | POA: Diagnosis not present

## 2015-04-12 DIAGNOSIS — I10 Essential (primary) hypertension: Secondary | ICD-10-CM

## 2015-04-12 DIAGNOSIS — R519 Headache, unspecified: Secondary | ICD-10-CM

## 2015-04-12 MED ORDER — HYDROCHLOROTHIAZIDE 25 MG PO TABS: 25.0000 mg | ORAL_TABLET | Freq: Every day | ORAL | Status: DC

## 2015-04-12 MED ORDER — AMOXICILLIN 875 MG PO TABS: 875.0000 mg | ORAL_TABLET | Freq: Two times a day (BID) | ORAL | Status: DC

## 2015-04-12 MED ORDER — INDOMETHACIN 50 MG PO CAPS: 50.0000 mg | ORAL_CAPSULE | Freq: Two times a day (BID) | ORAL | Status: DC

## 2015-04-12 NOTE — Progress Notes (Signed)
Subjective:    Patient ID: Raymond Hansen, male    DOB: 12-Dec-1970, 44 y.o.   MRN: [id number]  HPI  Patient developed a headache in his right temple above his right eye approximately 4-5 days ago. The pain is constant and dull and a pressure-like sensation. It is worse with extraocular movement. It is worse when he leans forward to bend over and pick up things. He also reports disequilibrium and pressure behind his eye. He denies nausea or vomiting. He denies fever. He does report seasonal allergies and he has been congested recently. He has no past medical history of head trauma. He has not fallen or hurt himself recently. He denies any fever or stiff neck. He does have a past medical history of melanoma. Past Medical History  Diagnosis Date  . GERD (gastroesophageal reflux disease)   . Melanoma 2015    3 lesions removed- [facility]   Past Surgical History  Procedure Laterality Date  . Shoulder surgery    . Hernia repair  1974  . Shoulder surgery Right 2004   Current Outpatient Prescriptions on File Prior to Visit  Medication Sig Dispense Refill  . loratadine (CLARITIN) 10 MG tablet Take 10 mg by mouth daily.    . meloxicam (MOBIC) 15 MG tablet Take 1 tablet (15 mg total) by mouth daily. 30 tablet 2  . Omega-3 Fatty Acids (FISH OIL) 1000 MG CAPS Take 1 capsule (1,000 mg total) by mouth 2 (two) times daily. 60 capsule 6  . omeprazole (PRILOSEC) 40 MG capsule TAKE ONE CAPSULE BY MOUTH EVERY DAY 30 capsule 10   No current facility-administered medications on file prior to visit.   Allergies  Allergen Reactions  . Codeine     Since childhood  . Tricor [Fenofibrate]     Body aches    Social History   Social History  . Marital Status: Married    Spouse Name: N/A  . Number of Children: N/A  . Years of Education: N/A   Occupational History  . Not on file.   Social History Main Topics  . Smoking status: Never Smoker   . Smokeless tobacco: Former Systems developer    Quit  date: 04/23/2011  . Alcohol Use: 14.4 oz/week    24 Cans of beer per week  . Drug Use: No  . Sexual Activity: Yes   Other Topics Concern  . Not on file   Social History Narrative      Review of Systems  All other systems reviewed and are negative.      Objective:   Physical Exam  Constitutional: He is oriented to person, place, and time. He appears well-developed and well-nourished. No distress.  HENT:  Head: Normocephalic and atraumatic.  Right Ear: External ear normal.  Left Ear: External ear normal.  Nose: Nose normal.  Mouth/Throat: Oropharynx is clear and moist. No oropharyngeal exudate.  Eyes: Conjunctivae and EOM are normal. Pupils are equal, round, and reactive to light.  Neck: Neck supple.  Cardiovascular: Normal rate, regular rhythm and normal heart sounds.   No murmur heard. Pulmonary/Chest: Effort normal and breath sounds normal. No respiratory distress. He has no wheezes. He has no rales.  Abdominal: Soft. Bowel sounds are normal.  Neurological: He is alert and oriented to person, place, and time. He has normal reflexes. He displays normal reflexes. No cranial nerve deficit. He exhibits normal muscle tone. Coordination normal.  Skin: He is not diaphoretic.          Assessment &  Plan:  Acute rhinosinusitis - Plan: amoxicillin (AMOXIL) 875 MG tablet  Benign essential HTN - Plan: hydrochlorothiazide (HYDRODIURIL) 25 MG tablet  Headache above the eye region - Plan: indomethacin (INDOCIN) 50 MG capsule  physical exam today is completely normal. Differential diagnosis includes sinus infection, tension headache, hemicrania, cluster headache, or increased intracranial pressure secondary to mass lesion. I will treat the patient like a possible sinus infection versus a tension headache versus hemicrania with a combination of amoxicillin 875 mg by mouth twice a day as well as indomethacin 50 mg by mouth twice a day. If symptoms improve over the next week no further  workup is necessary. However if symptoms persist or worsen, I'll proceed with a CT scan of the head particularly given the patient's past medical history of melanoma. I will also treat his blood pressure today with hydrochlorothiazide 25 mg by mouth daily

## 2015-09-03 ENCOUNTER — Telehealth: Payer: Self-pay | Admitting: *Deleted

## 2015-09-03 NOTE — Telephone Encounter (Signed)
Fax refill request received for Meloxicam.  Pt was instructed to return to office if continuing to have problems.  Denied request returned.

## 2015-10-20 ENCOUNTER — Other Ambulatory Visit: Payer: Self-pay | Admitting: Family Medicine

## 2015-10-22 NOTE — Telephone Encounter (Signed)
Refill appropriate and filled per protocol. 

## 2015-11-23 ENCOUNTER — Other Ambulatory Visit: Payer: Self-pay | Admitting: Family Medicine

## 2015-11-23 NOTE — Telephone Encounter (Signed)
Refill appropriate and filled per protocol. 

## 2015-12-27 ENCOUNTER — Other Ambulatory Visit: Payer: Self-pay | Admitting: Family Medicine

## 2016-01-09 ENCOUNTER — Encounter: Payer: BLUE CROSS/BLUE SHIELD | Admitting: Family Medicine

## 2016-01-23 ENCOUNTER — Encounter: Payer: BLUE CROSS/BLUE SHIELD | Admitting: Family Medicine

## 2016-02-14 ENCOUNTER — Other Ambulatory Visit: Payer: 59

## 2016-02-14 DIAGNOSIS — I10 Essential (primary) hypertension: Secondary | ICD-10-CM

## 2016-02-14 DIAGNOSIS — E781 Pure hyperglyceridemia: Secondary | ICD-10-CM

## 2016-02-14 DIAGNOSIS — Z0000 Encounter for general adult medical examination without abnormal findings: Secondary | ICD-10-CM

## 2016-02-14 LAB — CBC WITH DIFFERENTIAL/PLATELET
BASOS PCT: 1 %
Basophils Absolute: 55 cells/uL (ref 0–200)
EOS PCT: 3 %
Eosinophils Absolute: 165 cells/uL (ref 15–500)
HCT: 46.5 % (ref 38.5–50.0)
Hemoglobin: 16.7 g/dL (ref 13.0–17.0)
LYMPHS PCT: 28 %
Lymphs Abs: 1540 cells/uL (ref 850–3900)
MCH: 32.9 pg (ref 27.0–33.0)
MCHC: 35.9 g/dL (ref 32.0–36.0)
MCV: 91.7 fL (ref 80.0–100.0)
MONOS PCT: 9 %
MPV: 9 fL (ref 7.5–12.5)
Monocytes Absolute: 495 cells/uL (ref 200–950)
NEUTROS ABS: 3245 {cells}/uL (ref 1500–7800)
Neutrophils Relative %: 59 %
Platelets: 204 10*3/uL (ref 140–400)
RBC: 5.07 MIL/uL (ref 4.20–5.80)
RDW: 13.5 % (ref 11.0–15.0)
WBC: 5.5 10*3/uL (ref 3.8–10.8)

## 2016-02-14 LAB — COMPLETE METABOLIC PANEL WITH GFR
ALBUMIN: 4.5 g/dL (ref 3.6–5.1)
ALK PHOS: 55 U/L (ref 40–115)
ALT: 46 U/L (ref 9–46)
AST: 39 U/L (ref 10–40)
BILIRUBIN TOTAL: 1 mg/dL (ref 0.2–1.2)
BUN: 12 mg/dL (ref 7–25)
CALCIUM: 9.4 mg/dL (ref 8.6–10.3)
CO2: 29 mmol/L (ref 20–31)
Chloride: 101 mmol/L (ref 98–110)
Creat: 0.93 mg/dL (ref 0.60–1.35)
Glucose, Bld: 80 mg/dL (ref 70–99)
Potassium: 4.2 mmol/L (ref 3.5–5.3)
Sodium: 142 mmol/L (ref 135–146)
TOTAL PROTEIN: 6.8 g/dL (ref 6.1–8.1)

## 2016-02-14 LAB — LIPID PANEL
CHOLESTEROL: 176 mg/dL (ref 125–200)
HDL: 40 mg/dL (ref >=40)
LDL Cholesterol: 84 mg/dL (ref <130)
TRIGLYCERIDES: 261 mg/dL — AB (ref <150)
Total CHOL/HDL Ratio: 4.4 Ratio (ref <=5.0)
VLDL: 52 mg/dL — ABNORMAL HIGH (ref <30)

## 2016-02-20 ENCOUNTER — Ambulatory Visit (INDEPENDENT_AMBULATORY_CARE_PROVIDER_SITE_OTHER): Payer: 59 | Admitting: Family Medicine

## 2016-02-20 ENCOUNTER — Encounter: Payer: Self-pay | Admitting: Family Medicine

## 2016-02-20 VITALS — BP 132/68 | HR 74 | Temp 98.4°F | Ht 73.0 in | Wt 238.0 lb

## 2016-02-20 DIAGNOSIS — Z0000 Encounter for general adult medical examination without abnormal findings: Secondary | ICD-10-CM | POA: Diagnosis not present

## 2016-02-20 DIAGNOSIS — I10 Essential (primary) hypertension: Secondary | ICD-10-CM | POA: Diagnosis not present

## 2016-02-20 DIAGNOSIS — E669 Obesity, unspecified: Secondary | ICD-10-CM

## 2016-02-20 DIAGNOSIS — E781 Pure hyperglyceridemia: Secondary | ICD-10-CM

## 2016-02-20 MED ORDER — HYDROCHLOROTHIAZIDE 25 MG PO TABS: 25.0000 mg | ORAL_TABLET | Freq: Every day | ORAL | 3 refills | Status: DC

## 2016-02-20 MED ORDER — MELOXICAM 15 MG PO TABS: 15.0000 mg | ORAL_TABLET | Freq: Every day | ORAL | 2 refills | Status: DC

## 2016-02-20 MED ORDER — OMEPRAZOLE 40 MG PO CPDR: 40.0000 mg | DELAYED_RELEASE_CAPSULE | Freq: Every day | ORAL | 3 refills | Status: DC

## 2016-02-20 NOTE — Progress Notes (Signed)
   Subjective:    Patient ID: Raymond Hansen, male    DOB: 07-21-1970, 45 y.o.   MRN: [id number]  Patient presents for CPE (has had labs)  Patient here for complete physical exam medications reviewed. History reviewed.  He is currently under treatment for hyperlipidemia anemia mostly triglycerides he is taking omega-3 Fish oil his fasting labs are reviewed- triglycerides have improved from 329 down to 261 and he does not tolerate cholesterol medication  Immunizations up-to-date  Obesity- weight down 9lbs since Sept of last year  His appointment with his dermatologist for his yearly skin exam next week he also has appointment with his dentist  Review Of Systems:  GEN- denies fatigue, fever, weight loss,weakness, recent illness HEENT- denies eye drainage, change in vision, nasal discharge, CVS- denies chest pain, palpitations RESP- denies SOB, cough, wheeze ABD- denies N/V, change in stools, abd pain GU- denies dysuria, hematuria, dribbling, incontinence MSK- denies joint pain, muscle aches, injury Neuro- denies headache, dizziness, syncope, seizure activity       Objective:    BP 132/68 (BP Location: Left Arm, Patient Position: Sitting, Cuff Size: Large)   Pulse 74   Temp 98.4 F (36.9 C) (Oral)   Ht 6\' 1"  (1.854 m)   Wt 238 lb (108 kg)   BMI 31.40 kg/m  GEN- NAD, alert and oriented x3 HEENT- PERRL, EOMI, non injected sclera, pink conjunctiva, MMM, oropharynx clear Neck- Supple, no thyromegaly CVS- RRR, no murmur RESP-CTAB ABD-NABS,soft,NT,ND EXT- No edema Pulses- Radial, DP- 2+        Assessment & Plan:      Problem List Items Addressed This Visit    Routine general medical examination at a health care facility - Primary    Physical exam completed. Immunizations up-to-date. He continues to work on weight loss and diet his cholesterol/triglycerides continue to improve he will continue with the omega-3 in his diet and workup plan. No new medications needed.  Fasting glucose also significantly improved. Family history updated he will return in the fall for flu shot otherwise I will see him in one year for physical      Obesity   Hypertriglyceridemia   Relevant Medications   hydrochlorothiazide (HYDRODIURIL) 25 MG tablet   Essential hypertension, benign    Well controlled on HCTZ       Relevant Medications   hydrochlorothiazide (HYDRODIURIL) 25 MG tablet    Other Visit Diagnoses    Benign essential HTN       Relevant Medications   hydrochlorothiazide (HYDRODIURIL) 25 MG tablet      Note: This dictation was prepared with Dragon dictation along with smaller phrase technology. Any transcriptional errors that result from this process are unintentional.

## 2016-02-20 NOTE — Assessment & Plan Note (Signed)
Physical exam completed. Immunizations up-to-date. He continues to work on weight loss and diet his cholesterol/triglycerides continue to improve he will continue with the omega-3 in his diet and workup plan. No new medications needed. Fasting glucose also significantly improved. Family history updated he will return in the fall for flu shot otherwise I will see him in one year for physical

## 2016-02-20 NOTE — Assessment & Plan Note (Signed)
Well controlled on HCTZ 

## 2016-02-20 NOTE — Patient Instructions (Signed)
F/U 1 year I recommend eye visit once a year I recommend dental visit every 6 months Goal is to  Exercise 30 minutes 5 days a week

## 2016-08-02 ENCOUNTER — Other Ambulatory Visit: Payer: Self-pay | Admitting: Family Medicine

## 2016-08-13 ENCOUNTER — Encounter: Payer: Self-pay | Admitting: Family Medicine

## 2016-08-13 ENCOUNTER — Ambulatory Visit (INDEPENDENT_AMBULATORY_CARE_PROVIDER_SITE_OTHER): Payer: 59 | Admitting: Family Medicine

## 2016-08-13 VITALS — BP 128/70 | HR 78 | Temp 98.6°F | Resp 16 | Ht 73.0 in | Wt 237.0 lb

## 2016-08-13 DIAGNOSIS — J019 Acute sinusitis, unspecified: Secondary | ICD-10-CM

## 2016-08-13 MED ORDER — AMOXICILLIN 875 MG PO TABS: 875.0000 mg | ORAL_TABLET | Freq: Two times a day (BID) | ORAL | 0 refills | Status: DC

## 2016-08-13 NOTE — Patient Instructions (Signed)
F/U as needed

## 2016-08-13 NOTE — Progress Notes (Signed)
   Subjective:    Patient ID: Raymond Hansen, male    DOB: [birth year], 46 y.o.   MRN: [id number]  Patient presents for Illness (x3 weeks- sinus pressure, HA, post nasal drip, productive cough with yellow mucus) Issue here with sinus pressure or drainage headache postnasal drip and mild cough mostly from drainage for the past 3 weeks on and off. States he had a cold a few weeks ago thought he was getting over but still has nasal congestion now his symptoms come back worse over the past 4 days he has not had any fever. He's been using Sudafed and Mucinex but still not improving.     Review Of Systems:  GEN-+ fatigue, fever, weight loss,weakness, recent illness HEENT- denies eye drainage, change in vision, nasal discharge, CVS- denies chest pain, palpitations RESP- denies SOB, +cough, wheeze ABD- denies N/V, change in stools, abd pain GU- denies dysuria, hematuria, dribbling, incontinence MSK- denies joint pain, muscle aches, injury Neuro- + headache, dizziness, syncope, seizure activity       Objective:    BP 128/70 (BP Location: Left Arm, Patient Position: Sitting, Cuff Size: Large)   Pulse 78   Temp 98.6 F (37 C) (Oral)   Resp 16   Ht 6\' 1"  (1.854 m)   Wt 237 lb (107.5 kg)   SpO2 99%   BMI 31.27 kg/m  GEN- NAD, alert and oriented x3 HEENT- PERRL, EOMI, non injected sclera, pink conjunctiva, MMM, oropharynx mild injection, TM clear bilat no effusion,  + maxillary sinus tenderness, inflammed turbinates,  Nasal drainage  Neck- Supple, shotty LAD CVS- RRR, no murmur RESP-CTAB Pulses- Radial 2+          Assessment & Plan:      Problem List Items Addressed This Visit    None    Visit Diagnoses    Acute non-recurrent sinusitis, unspecified location    -  Primary   AMoxicillin x 10 days, nasal saline/flonase, continue  mucinex, stop Sudafed taken long enough avoid rebound congestion    Relevant Medications   amoxicillin (AMOXIL) 875 MG tablet      Note: This  dictation was prepared with Dragon dictation along with smaller phrase technology. Any transcriptional errors that result from this process are unintentional.

## 2016-09-05 DIAGNOSIS — Z85820 Personal history of malignant melanoma of skin: Secondary | ICD-10-CM | POA: Diagnosis not present

## 2016-10-05 ENCOUNTER — Other Ambulatory Visit: Payer: Self-pay | Admitting: Family Medicine

## 2017-02-23 ENCOUNTER — Other Ambulatory Visit: Payer: 59

## 2017-02-27 ENCOUNTER — Encounter: Payer: 59 | Admitting: Family Medicine

## 2017-03-09 ENCOUNTER — Other Ambulatory Visit: Payer: Self-pay | Admitting: Family Medicine

## 2017-04-28 DIAGNOSIS — Z85820 Personal history of malignant melanoma of skin: Secondary | ICD-10-CM | POA: Diagnosis not present

## 2017-06-01 ENCOUNTER — Other Ambulatory Visit: Payer: Self-pay | Admitting: Family Medicine

## 2017-07-19 DIAGNOSIS — Z23 Encounter for immunization: Secondary | ICD-10-CM | POA: Diagnosis not present

## 2017-08-10 ENCOUNTER — Other Ambulatory Visit: Payer: Self-pay | Admitting: Family Medicine

## 2017-09-10 ENCOUNTER — Other Ambulatory Visit: Payer: Self-pay | Admitting: Family Medicine

## 2017-10-05 ENCOUNTER — Other Ambulatory Visit: Payer: Self-pay | Admitting: Family Medicine

## 2017-11-12 ENCOUNTER — Other Ambulatory Visit: Payer: Self-pay | Admitting: Family Medicine

## 2017-11-20 ENCOUNTER — Other Ambulatory Visit: Payer: Self-pay | Admitting: Family Medicine

## 2017-11-23 ENCOUNTER — Other Ambulatory Visit: Payer: Self-pay | Admitting: Family Medicine

## 2017-11-23 ENCOUNTER — Encounter: Payer: Self-pay | Admitting: Family Medicine

## 2017-11-23 MED ORDER — OMEPRAZOLE 40 MG PO CPDR: 40.0000 mg | DELAYED_RELEASE_CAPSULE | Freq: Every day | ORAL | 0 refills | Status: DC

## 2017-12-19 ENCOUNTER — Other Ambulatory Visit: Payer: Self-pay | Admitting: Family Medicine

## 2017-12-21 ENCOUNTER — Other Ambulatory Visit: Payer: Self-pay | Admitting: Family Medicine

## 2017-12-21 MED ORDER — OMEPRAZOLE 40 MG PO CPDR: 40.0000 mg | DELAYED_RELEASE_CAPSULE | Freq: Every day | ORAL | 0 refills | Status: DC

## 2017-12-22 ENCOUNTER — Encounter: Payer: 59 | Admitting: Family Medicine

## 2017-12-24 ENCOUNTER — Emergency Department
Admission: EM | Admit: 2017-12-24 | Discharge: 2017-12-24 | Disposition: A | Discharge status: Home / Self Care | Payer: 59 | Attending: Emergency Medicine | Admitting: Emergency Medicine

## 2017-12-24 ENCOUNTER — Other Ambulatory Visit: Payer: Self-pay

## 2017-12-24 ENCOUNTER — Emergency Department: Payer: 59

## 2017-12-24 ENCOUNTER — Encounter: Payer: Self-pay | Admitting: Emergency Medicine

## 2017-12-24 DIAGNOSIS — R000 Tachycardia, unspecified: Secondary | ICD-10-CM | POA: Diagnosis present

## 2017-12-24 DIAGNOSIS — I480 Paroxysmal atrial fibrillation: Secondary | ICD-10-CM | POA: Diagnosis not present

## 2017-12-24 DIAGNOSIS — I10 Essential (primary) hypertension: Secondary | ICD-10-CM | POA: Insufficient documentation

## 2017-12-24 DIAGNOSIS — I4891 Unspecified atrial fibrillation: Secondary | ICD-10-CM | POA: Diagnosis not present

## 2017-12-24 DIAGNOSIS — R002 Palpitations: Secondary | ICD-10-CM | POA: Diagnosis not present

## 2017-12-24 DIAGNOSIS — Z79899 Other long term (current) drug therapy: Secondary | ICD-10-CM | POA: Diagnosis not present

## 2017-12-24 LAB — COMPREHENSIVE METABOLIC PANEL
ALK PHOS: 76 U/L (ref 38–126)
ALT: 55 U/L (ref 17–63)
ANION GAP: 9 (ref 5–15)
AST: 54 U/L — ABNORMAL HIGH (ref 15–41)
Albumin: 5.3 g/dL — ABNORMAL HIGH (ref 3.5–5.0)
BILIRUBIN TOTAL: 1.2 mg/dL (ref 0.3–1.2)
BUN: 11 mg/dL (ref 6–20)
CALCIUM: 9.6 mg/dL (ref 8.9–10.3)
CO2: 28 mmol/L (ref 22–32)
Chloride: 102 mmol/L (ref 101–111)
Creatinine, Ser: 1.02 mg/dL (ref 0.61–1.24)
GFR calc Af Amer: >60 mL/min (ref >60)
Glucose, Bld: 119 mg/dL — ABNORMAL HIGH (ref 65–99)
POTASSIUM: 3.4 mmol/L — AB (ref 3.5–5.1)
Sodium: 139 mmol/L (ref 135–145)
TOTAL PROTEIN: 8.3 g/dL — AB (ref 6.5–8.1)

## 2017-12-24 LAB — T4, FREE: FREE T4: 0.78 ng/dL — AB (ref 0.82–1.77)

## 2017-12-24 LAB — CBC
HEMATOCRIT: 49.7 % (ref 40.0–52.0)
Hemoglobin: 18.1 g/dL — ABNORMAL HIGH (ref 13.0–18.0)
MCH: 33.8 pg (ref 26.0–34.0)
MCHC: 36.4 g/dL — AB (ref 32.0–36.0)
MCV: 92.8 fL (ref 80.0–100.0)
Platelets: 250 10*3/uL (ref 150–440)
RBC: 5.35 MIL/uL (ref 4.40–5.90)
RDW: 13.5 % (ref 11.5–14.5)
WBC: 6.5 10*3/uL (ref 3.8–10.6)

## 2017-12-24 LAB — TSH: TSH: 6.623 u[IU]/mL — AB (ref 0.350–4.500)

## 2017-12-24 LAB — TROPONIN I: Troponin I: <0.03 ng/mL (ref <0.03)

## 2017-12-24 MED ORDER — DILTIAZEM HCL 25 MG/5ML IV SOLN
10.0000 mg | Freq: Once | INTRAVENOUS | Status: AC
  Administered 2017-12-24: 10 mg via INTRAVENOUS

## 2017-12-24 MED ORDER — DILTIAZEM HCL 25 MG/5ML IV SOLN
INTRAVENOUS | Status: AC
  Filled 2017-12-24: qty 5

## 2017-12-24 MED ORDER — DILTIAZEM HCL 100 MG IV SOLR
5.0000 mg/h | Freq: Once | INTRAVENOUS | Status: DC
  Filled 2017-12-24: qty 100

## 2017-12-24 MED ORDER — ASPIRIN 81 MG PO CHEW
324.0000 mg | CHEWABLE_TABLET | Freq: Once | ORAL | Status: AC
  Administered 2017-12-24: 324 mg via ORAL

## 2017-12-24 MED ORDER — DILTIAZEM HCL 60 MG PO TABS
90.0000 mg | ORAL_TABLET | Freq: Once | ORAL | Status: AC
Start: 2017-12-24 — End: 2017-12-24
  Administered 2017-12-24: 90 mg via ORAL
  Filled 2017-12-24: qty 2

## 2017-12-24 MED ORDER — DILTIAZEM HCL 25 MG/5ML IV SOLN
INTRAVENOUS | Status: AC
  Administered 2017-12-24: 10 mg via INTRAVENOUS
  Filled 2017-12-24: qty 5

## 2017-12-24 MED ORDER — ASPIRIN 81 MG PO CHEW
CHEWABLE_TABLET | ORAL | Status: AC
  Filled 2017-12-24: qty 4

## 2017-12-24 MED ORDER — MAGNESIUM SULFATE 4 GM/100ML IV SOLN
4.0000 g | Freq: Once | INTRAVENOUS | Status: AC
Start: 2017-12-24 — End: 2017-12-24
  Administered 2017-12-24: 4 g via INTRAVENOUS
  Filled 2017-12-24: qty 100

## 2017-12-24 MED ORDER — DILTIAZEM HCL 25 MG/5ML IV SOLN
20.0000 mg | Freq: Once | INTRAVENOUS | Status: AC
  Administered 2017-12-24: 20 mg via INTRAVENOUS

## 2017-12-24 NOTE — ED Triage Notes (Signed)
Patient ambulatory to triage with steady gait, without difficulty or distress noted; pt reports awoke to go to [facility] at 525am and noted heart racing accomp by feeling light-headed; denies hx of same; denies pain

## 2017-12-24 NOTE — ED Provider Notes (Signed)
[facility] Emergency Department Provider Note    First MD Initiated Contact with Patient 12/24/17 [phone]     (approximate)  I have reviewed the triage vital signs and the nursing notes.   HISTORY  Chief Complaint Tachycardia   HPI Raymond Hansen is a 47 y.o. male with below list of chronic medical conditions presents emergency department with acute onset of rapid irregular heartbeats which began at 5:25 AM this morning.  Patient states that he also feels "lightheaded".  In addition patient admits to left shoulder discomfort.  Past Medical History:  Diagnosis Date  . GERD (gastroesophageal reflux disease)   . Melanoma ([place]) 2015   3 lesions removed- [facility]    Patient Active Problem List   Diagnosis Date Noted  . Hypertriglyceridemia 02/21/2014  . Routine general medical examination at a health care facility 10/31/2013  . Left shoulder pain 04/22/2013  . GERD (gastroesophageal reflux disease) 04/22/2013  . Essential hypertension, benign 04/22/2013  . Obesity 04/22/2013    Past Surgical History:  Procedure Laterality Date  . HERNIA REPAIR  1974  . SHOULDER SURGERY    . SHOULDER SURGERY Right 2004    Prior to Admission medications   Medication Sig Start Date End Date Taking? Authorizing Provider  amoxicillin (AMOXIL) 875 MG tablet Take 1 tablet (875 mg total) by mouth 2 (two) times daily. 08/13/16   [place], Modena Nunnery, MD  hydrochlorothiazide (HYDRODIURIL) 25 MG tablet Take 1 tablet (25 mg total) by mouth daily. 02/20/16   Alycia Rossetti, MD  loratadine (CLARITIN) 10 MG tablet Take 10 mg by mouth daily.    [provider]  meloxicam (MOBIC) 15 MG tablet TAKE 1 TABLET(15 MG) BY MOUTH DAILY 10/06/16   [place], Modena Nunnery, MD  Omega-3 Fatty Acids (FISH OIL) 1000 MG CAPS Take 1 capsule (1,000 mg total) by mouth 2 (two) times daily. 11/01/13   Alycia Rossetti, MD  omeprazole (PRILOSEC) 40 MG capsule Take 1 capsule (40 mg total)  by mouth daily. 12/21/17   Alycia Rossetti, MD    Allergies Codeine and Tricor [fenofibrate]  Family History  Problem Relation Age of Onset  . Multiple sclerosis Mother   . COPD Mother   . Diabetes Father   . Cancer Maternal Grandfather        lung/liver  . Heart disease Paternal Grandfather     Social History Social History   Tobacco Use  . Smoking status: Never Smoker  . Smokeless tobacco: Former Network engineer Use Topics  . Alcohol use: Yes    Alcohol/week: 14.4 oz    Types: 24 Cans of beer per week  . Drug use: No    Review of Systems Constitutional: No fever/chills Eyes: No visual changes. ENT: No sore throat. Cardiovascular: Positive for palpitations & chest pain Respiratory: Denies shortness of breath. Gastrointestinal: No abdominal pain.  No nausea, no vomiting.  No diarrhea.  No constipation. Genitourinary: Negative for dysuria. Musculoskeletal: Negative for neck pain.  Negative for back pain. Integumentary: Negative for rash. Neurological: Negative for headaches, focal weakness or numbness.   ____________________________________________   PHYSICAL EXAM:  VITAL SIGNS: ED Triage Vitals  Enc Vitals Group     BP 12/24/17 0557 (!) 174/100     Pulse Rate 12/24/17 0557 (!) 139     Resp 12/24/17 0557 18     Temp 12/24/17 0557 97.7 F (36.5 C)     Temp Source 12/24/17 0557 Oral     SpO2  12/24/17 0557 97 %     Weight 12/24/17 0553 111.1 kg (245 lb)     Height 12/24/17 0553 1.854 m (6\' 1" )     Head Circumference --      Peak Flow --      Pain Score 12/24/17 0553 0     Pain Loc --      Pain Edu? --      Excl. in [place]? --     Constitutional: Alert and oriented. Well appearing and in no acute distress. Eyes: Conjunctivae are normal.  Head: Atraumatic. Mouth/Throat: Mucous membranes are moist.  Oropharynx non-erythematous. Neck: No stridor.   Cardiovascular: Irregular Irregular tachycardia. Good peripheral circulation. Grossly normal heart  sounds. Respiratory: Normal respiratory effort.  No retractions. Lungs CTAB. Gastrointestinal: Soft and nontender. No distention.  Musculoskeletal: No lower extremity tenderness nor edema. No gross deformities of extremities. Neurologic:  Normal speech and language. No gross focal neurologic deficits are appreciated.  Skin:  Skin is warm, dry and intact. No rash noted.   ____________________________________________   LABS (all labs ordered are listed, but only abnormal results are displayed)  Labs Reviewed  CBC - Abnormal; Notable for the following components:      Result Value   Hemoglobin 18.1 (*)    MCHC 36.4 (*)    All other components within normal limits  COMPREHENSIVE METABOLIC PANEL - Abnormal; Notable for the following components:   Potassium 3.4 (*)    Glucose, Bld 119 (*)    Total Protein 8.3 (*)    Albumin 5.3 (*)    AST 54 (*)    All other components within normal limits  TSH - Abnormal; Notable for the following components:   TSH 6.623 (*)    All other components within normal limits  T4, FREE - Abnormal; Notable for the following components:   Free T4 0.78 (*)    All other components within normal limits  TROPONIN I   ____________________________________________  EKG  ED ECG REPORT I, [place] N Austina Constantin, the attending physician, personally viewed and interpreted this ECG.   Date: 12/25/2017  EKG Time: 5:56 AM  Rate: 148  Rhythm: Atrial fibrillation with rapid ventricular response  Axis: Normal  Intervals: Irregular RR interval  ST&T Change: None  ED ECG REPORT I, [place] Tabathia Knoche, the attending physician, personally viewed and interpreted this ECG.   Date: 12/25/2017  EKG Time: 7:38 AM  Rate: 90  Rhythm: Normal sinus rhythm  Axis: Normal  Intervals: Normal  ST&T Change: None   ____________________________________________  RADIOLOGY I, [place] N Kaity Pitstick, personally viewed and evaluated these images (plain radiographs) as part of my medical  decision making, as well as reviewing the written report by the radiologist.  ED MD interpretation: No active disease per radiologist  Official radiology report(s): Dg Chest Port 1 View  Result Date: 12/24/2017 CLINICAL DATA:  Rapid ventricular rhythm EXAM: PORTABLE CHEST 1 VIEW COMPARISON:  None. FINDINGS: Normal heart size. Normal mediastinal contour. No pneumothorax. No pleural effusion. Lungs appear clear, with no acute consolidative airspace disease and no pulmonary edema. IMPRESSION: No active disease. Electronically Signed   By: Ilona Sorrel M.D.   On: 12/24/2017 08:38    ____________________________________________   PROCEDURES  Critical Care performed:     .Critical Care Performed by: Gregor Hams, MD Authorized by: Gregor Hams, MD   Critical care provider statement:    Critical care time (minutes):  30   Critical care time was exclusive of:  Separately billable  procedures and treating other patients   Critical care was necessary to treat or prevent imminent or life-threatening deterioration of the following conditions: arrhythmia.   Critical care was time spent personally by me on the following activities:  Development of treatment plan with patient or surrogate, discussions with consultants, evaluation of patient's response to treatment, examination of patient, obtaining history from patient or surrogate, ordering and performing treatments and interventions, ordering and review of laboratory studies, ordering and review of radiographic studies, pulse oximetry, re-evaluation of patient's condition and review of old charts   I assumed direction of critical care for this patient from another provider in my specialty: no       ____________________________________________   INITIAL IMPRESSION / ASSESSMENT AND PLAN / ED COURSE  As part of my medical decision making, I reviewed the following data within the electronic MEDICAL RECORD NUMBER 47 year old male presenting  with above-stated history and physical exam with EKG consistent with atrial fibrillation with rapid ventricular response.  Patient given IV diltiazem 20 mg x 2 boluses with resultant return of normal sinus rhythm and resolution of atrial fibrillation with rapid ventricular response.  Patient on reexamination has no chest pain lightheadedness or shortness of breath. Patient discussed with Dr. Clayborn Bigness who agreed with plan for patient to be discharged and follow-up in office today.  Patient is currently normal sinus rhythm with a rate of 88.  Patient has no chest pain or dyspnea at present. ____________________________________________  FINAL CLINICAL IMPRESSION(S) / ED DIAGNOSES  Final diagnoses:  Atrial fibrillation with RVR (HCC)     MEDICATIONS GIVEN DURING THIS VISIT:  Medications  diltiazem (CARDIZEM) injection 10 mg (10 mg Intravenous Given 12/24/17 0623)  diltiazem (CARDIZEM) injection 10 mg (10 mg Intravenous Given 12/24/17 0615)  aspirin chewable tablet 324 mg (324 mg Oral Given 12/24/17 0627)  diltiazem (CARDIZEM) injection 20 mg (20 mg Intravenous Given 12/24/17 0700)  magnesium sulfate IVPB 4 g 100 mL (0 g Intravenous Stopped 12/24/17 0738)  diltiazem (CARDIZEM) tablet 90 mg (90 mg Oral Given 12/24/17 0801)     ED Discharge Orders    None       Note:  This document was prepared using Dragon voice recognition software and may include unintentional dictation errors.    Gregor Hams, MD 12/25/17 [phone]

## 2017-12-25 ENCOUNTER — Telehealth: Payer: Self-pay | Admitting: Emergency Medicine

## 2017-12-25 DIAGNOSIS — I4891 Unspecified atrial fibrillation: Secondary | ICD-10-CM | POA: Diagnosis not present

## 2017-12-25 DIAGNOSIS — E781 Pure hyperglyceridemia: Secondary | ICD-10-CM | POA: Diagnosis not present

## 2017-12-25 DIAGNOSIS — I10 Essential (primary) hypertension: Secondary | ICD-10-CM | POA: Diagnosis not present

## 2017-12-25 NOTE — Telephone Encounter (Addendum)
Called pateint to inform of thyroid lab test results.  He needs to follow up with pcp for this.  Left message with my number and instructions to follow  With pcp as well.     The patient called me back.. I gave him the results and recommended follow up with pcp.  He agrees

## 2017-12-28 DIAGNOSIS — Z85820 Personal history of malignant melanoma of skin: Secondary | ICD-10-CM | POA: Diagnosis not present

## 2017-12-28 DIAGNOSIS — D2262 Melanocytic nevi of left upper limb, including shoulder: Secondary | ICD-10-CM | POA: Diagnosis not present

## 2017-12-28 DIAGNOSIS — D2261 Melanocytic nevi of right upper limb, including shoulder: Secondary | ICD-10-CM | POA: Diagnosis not present

## 2017-12-28 DIAGNOSIS — I4891 Unspecified atrial fibrillation: Secondary | ICD-10-CM | POA: Diagnosis not present

## 2018-01-04 ENCOUNTER — Ambulatory Visit: Payer: 59 | Admitting: Family Medicine

## 2018-01-06 ENCOUNTER — Encounter: Payer: Self-pay | Admitting: Physician Assistant

## 2018-01-06 ENCOUNTER — Other Ambulatory Visit: Payer: Self-pay

## 2018-01-06 ENCOUNTER — Ambulatory Visit: Payer: 59 | Admitting: Physician Assistant

## 2018-01-06 VITALS — BP 140/100 | HR 71 | Temp 97.8°F | Resp 18 | Ht 73.0 in | Wt 250.8 lb

## 2018-01-06 DIAGNOSIS — K219 Gastro-esophageal reflux disease without esophagitis: Secondary | ICD-10-CM

## 2018-01-06 DIAGNOSIS — I4891 Unspecified atrial fibrillation: Secondary | ICD-10-CM | POA: Diagnosis not present

## 2018-01-06 DIAGNOSIS — E079 Disorder of thyroid, unspecified: Secondary | ICD-10-CM

## 2018-01-06 MED ORDER — OMEPRAZOLE 40 MG PO CPDR
40.0000 mg | DELAYED_RELEASE_CAPSULE | Freq: Every day | ORAL | 2 refills | Status: DC
Start: 2018-01-06 — End: 2018-07-26

## 2018-01-06 NOTE — Progress Notes (Signed)
Patient ID: Raymond Hansen MRN: [id number], DOB: 1971-01-05, 47 y.o. Date of Encounter: 01/06/2018, 3:03 PM    Chief Complaint:  Chief Complaint  Patient presents with  . discuss thyroid    had blood work done levels were TSH 6.623 and T4 .78     HPI: 47 y.o. year old male presents with above. presents with above.   I reviewed ER note from 12/24/2017.  At that time he presented with acute onset of rapid irregular heartbeats which had began at 5:25 that morning.  Reported that he also felt lightheaded.  Also had some discomfort towards the left shoulder.  EKG revealed atrial fibrillation with rapid ventricular response rate 148.  Was given IV diltiazem 20 mg x 2 boluses with resultant return to normal sinus rhythm and resolution of atrial fibrillation with rapid ventricular response.  Upon reexamination he had no chest pain, no lightheadedness, or shortness of breath. He was in normal sinus rhythm with rate 88. Planned for discharge with follow-up at Cardiology office later that day.  Today he reports that he has had follow-up with Cardiology.  States that they gave him metoprolol to use as needed.  Reports that on Monday at about 2 PM he noticed some palpitations and took a metoprolol and that 30 minutes later symptoms persisted so he took another metoprolol.  States that that evening he started feeling palpitations again and took a third metoprolol that evening.   Says that on Monday had to take total of 3 metoprolol.   Reports that he "has worn a monitor and tomorrow is scheduled for a nuclear stress test and echocardiogram."  Reports that while he was at the cardiology visit, the emergency room left him a message on his phone regarding his lab results and that his thyroid labs were slightly abnormal and to follow-up with his PCP regarding this.  Therefore, he is here for follow-up of that.  He has no other additional concerns today.     Home Meds:   Outpatient Medications Prior to Visit    Medication Sig Dispense Refill  . hydrochlorothiazide (HYDRODIURIL) 25 MG tablet Take 1 tablet (25 mg total) by mouth daily. 90 tablet 3  . loratadine (CLARITIN) 10 MG tablet Take 10 mg by mouth daily.    . meloxicam (MOBIC) 15 MG tablet TAKE 1 TABLET(15 MG) BY MOUTH DAILY 30 tablet 0  . Omega-3 Fatty Acids (FISH OIL) 1000 MG CAPS Take 1 capsule (1,000 mg total) by mouth 2 (two) times daily. 60 capsule 6  . omeprazole (PRILOSEC) 40 MG capsule Take 1 capsule (40 mg total) by mouth daily. 30 capsule 0  . amoxicillin (AMOXIL) 875 MG tablet Take 1 tablet (875 mg total) by mouth 2 (two) times daily. 20 tablet 0   No facility-administered medications prior to visit.     Allergies:  Allergies  Allergen Reactions  . Codeine     Since childhood  . Tricor [Fenofibrate]     Body aches       Review of Systems: See HPI for pertinent ROS. All other ROS negative.    Physical Exam: Blood pressure (!) 140/100, pulse 71, temperature 97.8 F (36.6 C), temperature source Oral, resp. rate 18, height 6\' 1"  (1.854 m), weight 113.8 kg (250 lb 12.8 oz), SpO2 97 %., Body mass index is 33.09 kg/m. General: WNWD WM.  Appears in no acute distress. Neck: Supple. No thyromegaly. No lymphadenopathy. Lungs: Clear bilaterally to auscultation without wheezes, rales, or rhonchi. Breathing is unlabored. Heart: Regular  rhythm. No murmurs, rubs, or gallops. Msk:  Strength and tone normal for age. Extremities/Skin: Warm and dry. Neuro: Alert and oriented X 3. Moves all extremities spontaneously. Gait is normal. CNII-XII grossly in tact. Psych:  Responds to questions appropriately with a normal affect.     ASSESSMENT AND PLAN:  47 y.o. year old male with   1. Thyroid disorder I discussed thyroid lab results with him. 12/24/17 ---TSH was 6.623 and free T4 was 0.78. I reviewed that these are barely out of normal range. Recommend that he recheck these labs in 1 month.  I will follow up with him regarding further  treatment plan once I get these results. - T4, free; Future - TSH; Future  2. Atrial fibrillation, unspecified type [facility]) --Is in the process of testing and follow-up with cardiology.  3. Gastroesophageal reflux disease, esophagitis presence not specified He is requesting refill on his omeprazole to use as needed for reflux. - omeprazole (PRILOSEC) 40 MG capsule; Take 1 capsule (40 mg total) by mouth daily.  Dispense: 90 capsule; Refill: 2   Signed, [address] [place], [place], [facility] 01/06/2018 3:03 PM

## 2018-01-07 DIAGNOSIS — I4891 Unspecified atrial fibrillation: Secondary | ICD-10-CM | POA: Diagnosis not present

## 2018-06-01 ENCOUNTER — Other Ambulatory Visit: Payer: Self-pay

## 2018-06-01 ENCOUNTER — Encounter: Payer: Self-pay | Admitting: Family Medicine

## 2018-06-01 ENCOUNTER — Ambulatory Visit: Payer: 59 | Admitting: Family Medicine

## 2018-06-01 VITALS — BP 138/86 | HR 68 | Temp 98.1°F | Resp 14 | Ht 73.0 in | Wt 255.0 lb

## 2018-06-01 DIAGNOSIS — Z23 Encounter for immunization: Secondary | ICD-10-CM | POA: Diagnosis not present

## 2018-06-01 DIAGNOSIS — R5383 Other fatigue: Secondary | ICD-10-CM

## 2018-06-01 DIAGNOSIS — E781 Pure hyperglyceridemia: Secondary | ICD-10-CM

## 2018-06-01 DIAGNOSIS — S91331A Puncture wound without foreign body, right foot, initial encounter: Secondary | ICD-10-CM | POA: Diagnosis not present

## 2018-06-01 DIAGNOSIS — R7989 Other specified abnormal findings of blood chemistry: Secondary | ICD-10-CM

## 2018-06-01 DIAGNOSIS — I10 Essential (primary) hypertension: Secondary | ICD-10-CM

## 2018-06-01 NOTE — Patient Instructions (Addendum)
Return Friday for fasting labs  TDAP given  F/U as previous

## 2018-06-01 NOTE — Progress Notes (Signed)
   Subjective:    Patient ID: Raymond Hansen, male    DOB: 11-22-1970, 47 y.o.   MRN: [id number]  Patient presents for Puncture to R Heel (stepped on rusty nail that punctured R heel through boot- no drainage or induration)   Saturday he was out hunting when he stepped on a nail.  He went through his hunting boot.  He soaked it in, epson salt and bactin OTC ointment rectally afterwards.  He has not had any significant pain redness or drainage.  He notes that he is due for a tetanus booster. Is well otherwise.  He also has labs for a physical coming up this Friday.  He would like to have his thyroid rechecked when he had atrial fibrillation his TSH was a little elevated.  He also has had some fatigue but admits to stress at work would like to have a testosterone added on want to discuss in more detail next week after he has his labs done at his physical exam.   Review Of Systems:  GEN- denies fatigue, fever, weight loss,weakness, recent illness HEENT- denies eye drainage, change in vision, nasal discharge, CVS- denies chest pain, palpitations RESP- denies SOB, cough, wheeze ABD- denies N/V, change in stools, abd pain GU- denies dysuria, hematuria, dribbling, incontinence MSK- denies joint pain, muscle aches, injury Neuro- denies headache, dizziness, syncope, seizure activity       Objective:    BP 138/86   Pulse 68   Temp 98.1 F (36.7 C) (Oral)   Resp 14   Ht 6\' 1"  (1.854 m)   Wt 255 lb (115.7 kg)   SpO2 96%   BMI 33.64 kg/m  GEN- NAD, alert and oriented x3 Psych- normal affect and mood Skin- Right foot- no puncture lesion seen, no erythema, FROM Foot        Assessment & Plan:      Problem List Items Addressed This Visit      Unprioritized   Essential hypertension, benign   Hypertriglyceridemia    Other Visit Diagnoses    Nail wound of right foot, initial encounter    -  Primary   no sign of infection, TDAP given   Relevant Orders   Tdap vaccine greater than  or equal to 7yo IM (Completed)   Need for tetanus, diphtheria, and acellular pertussis (Tdap) vaccine in patient of adolescent age or older       Relevant Orders   Tdap vaccine greater than or equal to 7yo IM (Completed)   Need for immunization against influenza       Relevant Orders   Flu Vaccine QUAD 36+ mos IM (Completed)   Fatigue, unspecified type       Abnormal TSH          Note: This dictation was prepared with Dragon dictation along with smaller phrase technology. Any transcriptional errors that result from this process are unintentional.

## 2018-06-04 ENCOUNTER — Other Ambulatory Visit: Payer: 59

## 2018-06-04 DIAGNOSIS — E781 Pure hyperglyceridemia: Secondary | ICD-10-CM

## 2018-06-04 DIAGNOSIS — I10 Essential (primary) hypertension: Secondary | ICD-10-CM | POA: Diagnosis not present

## 2018-06-04 DIAGNOSIS — R5383 Other fatigue: Secondary | ICD-10-CM

## 2018-06-04 DIAGNOSIS — R7989 Other specified abnormal findings of blood chemistry: Secondary | ICD-10-CM | POA: Diagnosis not present

## 2018-06-05 LAB — CBC WITH DIFFERENTIAL/PLATELET
BASOS ABS: 70 {cells}/uL (ref 0–200)
BASOS PCT: 1.1 %
EOS PCT: 3.4 %
Eosinophils Absolute: 218 cells/uL (ref 15–500)
HCT: 44.5 % (ref 38.5–50.0)
Hemoglobin: 15.7 g/dL (ref 13.2–17.1)
Lymphs Abs: 1555 cells/uL (ref 850–3900)
MCH: 32 pg (ref 27.0–33.0)
MCHC: 35.3 g/dL (ref 32.0–36.0)
MCV: 90.8 fL (ref 80.0–100.0)
MONOS PCT: 11.3 %
MPV: 9.3 fL (ref 7.5–12.5)
NEUTROS ABS: 3834 {cells}/uL (ref 1500–7800)
Neutrophils Relative %: 59.9 %
Platelets: 201 10*3/uL (ref 140–400)
RBC: 4.9 10*6/uL (ref 4.20–5.80)
RDW: 12 % (ref 11.0–15.0)
TOTAL LYMPHOCYTE: 24.3 %
WBC mixed population: 723 cells/uL (ref 200–950)
WBC: 6.4 10*3/uL (ref 3.8–10.8)

## 2018-06-05 LAB — COMPREHENSIVE METABOLIC PANEL
AG RATIO: 1.8 (calc) (ref 1.0–2.5)
ALT: 67 U/L — AB (ref 9–46)
AST: 44 U/L — AB (ref 10–40)
Albumin: 4.5 g/dL (ref 3.6–5.1)
Alkaline phosphatase (APISO): 59 U/L (ref 40–115)
BILIRUBIN TOTAL: 0.7 mg/dL (ref 0.2–1.2)
BUN: 11 mg/dL (ref 7–25)
CALCIUM: 9.6 mg/dL (ref 8.6–10.3)
CO2: 30 mmol/L (ref 20–32)
Chloride: 100 mmol/L (ref 98–110)
Creat: 0.98 mg/dL (ref 0.60–1.35)
Globulin: 2.5 g/dL (calc) (ref 1.9–3.7)
Glucose, Bld: 85 mg/dL (ref 65–99)
Potassium: 3.7 mmol/L (ref 3.5–5.3)
Sodium: 139 mmol/L (ref 135–146)
Total Protein: 7 g/dL (ref 6.1–8.1)

## 2018-06-05 LAB — T4, FREE: Free T4: 1 ng/dL (ref 0.8–1.8)

## 2018-06-05 LAB — LIPID PANEL
CHOLESTEROL: 203 mg/dL — AB (ref <200)
HDL: 39 mg/dL — AB (ref >40)
LDL CHOLESTEROL (CALC): 122 mg/dL — AB
Non-HDL Cholesterol (Calc): 164 mg/dL (calc) — ABNORMAL HIGH (ref <130)
TRIGLYCERIDES: 275 mg/dL — AB (ref <150)
Total CHOL/HDL Ratio: 5.2 (calc) — ABNORMAL HIGH (ref <5.0)

## 2018-06-05 LAB — TSH: TSH: 6 mIU/L — ABNORMAL HIGH (ref 0.40–4.50)

## 2018-06-05 LAB — T3, FREE: T3, Free: 3.6 pg/mL (ref 2.3–4.2)

## 2018-06-05 LAB — TESTOSTERONE: TESTOSTERONE: 308 ng/dL (ref 250–827)

## 2018-06-07 DIAGNOSIS — M9904 Segmental and somatic dysfunction of sacral region: Secondary | ICD-10-CM | POA: Diagnosis not present

## 2018-06-07 DIAGNOSIS — M545 Low back pain: Secondary | ICD-10-CM | POA: Diagnosis not present

## 2018-06-07 DIAGNOSIS — M9903 Segmental and somatic dysfunction of lumbar region: Secondary | ICD-10-CM | POA: Diagnosis not present

## 2018-06-08 DIAGNOSIS — M9903 Segmental and somatic dysfunction of lumbar region: Secondary | ICD-10-CM | POA: Diagnosis not present

## 2018-06-08 DIAGNOSIS — M545 Low back pain: Secondary | ICD-10-CM | POA: Diagnosis not present

## 2018-06-08 DIAGNOSIS — M9904 Segmental and somatic dysfunction of sacral region: Secondary | ICD-10-CM | POA: Diagnosis not present

## 2018-06-09 ENCOUNTER — Ambulatory Visit (INDEPENDENT_AMBULATORY_CARE_PROVIDER_SITE_OTHER): Payer: 59 | Admitting: Family Medicine

## 2018-06-09 ENCOUNTER — Other Ambulatory Visit: Payer: Self-pay

## 2018-06-09 ENCOUNTER — Encounter: Payer: Self-pay | Admitting: Family Medicine

## 2018-06-09 VITALS — BP 132/84 | HR 80 | Temp 98.1°F | Resp 16 | Ht 73.0 in | Wt 251.0 lb

## 2018-06-09 DIAGNOSIS — R945 Abnormal results of liver function studies: Secondary | ICD-10-CM | POA: Diagnosis not present

## 2018-06-09 DIAGNOSIS — R7989 Other specified abnormal findings of blood chemistry: Secondary | ICD-10-CM

## 2018-06-09 DIAGNOSIS — E039 Hypothyroidism, unspecified: Secondary | ICD-10-CM

## 2018-06-09 DIAGNOSIS — F5104 Psychophysiologic insomnia: Secondary | ICD-10-CM

## 2018-06-09 DIAGNOSIS — I10 Essential (primary) hypertension: Secondary | ICD-10-CM

## 2018-06-09 DIAGNOSIS — E6609 Other obesity due to excess calories: Secondary | ICD-10-CM

## 2018-06-09 DIAGNOSIS — Z0000 Encounter for general adult medical examination without abnormal findings: Secondary | ICD-10-CM | POA: Diagnosis not present

## 2018-06-09 DIAGNOSIS — E781 Pure hyperglyceridemia: Secondary | ICD-10-CM

## 2018-06-09 DIAGNOSIS — E038 Other specified hypothyroidism: Secondary | ICD-10-CM

## 2018-06-09 DIAGNOSIS — Z6833 Body mass index (BMI) 33.0-33.9, adult: Secondary | ICD-10-CM

## 2018-06-09 MED ORDER — LEVOTHYROXINE SODIUM 25 MCG PO TABS: 25.0000 ug | ORAL_TABLET | Freq: Every day | ORAL | 2 refills | Status: DC

## 2018-06-09 NOTE — Progress Notes (Signed)
   Subjective:    Patient ID: Raymond Hansen, male    DOB: October 19, 1970, 47 y.o.   MRN: [id number]  Patient presents for Annual Exam (is not fasting- would like thyroid checked) Pt here for CPE, fasting labs reviewed He is fatigued a lot, has some stressors which he gets very irritable at work and home. 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  He does not sleep well, but has not tried anything OTC He had a fib some months back had abnormal TSH at that time, repeat levels still show subclinical hypothyroidism   Prevention reviewed History reviewed Immunizations UTD    Review Of Systems:  GEN- denies fatigue, fever, weight loss,weakness, recent illness HEENT- denies eye drainage, change in vision, nasal discharge, CVS- denies chest pain, palpitations RESP- denies SOB, cough, wheeze ABD- denies N/V, change in stools, abd pain GU- denies dysuria, hematuria, dribbling, incontinence MSK- denies joint pain, muscle aches, injury Neuro- denies headache, dizziness, syncope, seizure activity       Objective:    BP 132/84   Pulse 80   Temp 98.1 F (36.7 C) (Oral)   Resp 16   Ht 6\' 1"  (1.854 m)   Wt 251 lb (113.9 kg)   SpO2 98%   BMI 33.12 kg/m  GEN- NAD, alert and oriented x3 HEENT- PERRL, EOMI, non injected sclera, pink conjunctiva, MMM, oropharynx clear Neck- Supple, no thyromegaly CVS- RRR, no murmur RESP-CTAB ABD-NABS,soft,NT,ND Psych- normal affect and mood EXT- No edema Pulses- Radial, DP- 2+    Audit C and depression NEGATIVE, 1 Fall     Assessment & Plan:      Problem List Items Addressed This Visit      Unprioritized   Essential hypertension, benign    Controlled no changes      Relevant Medications   aspirin EC 81 MG tablet   METOPROLOL TARTRATE PO   Hypertriglyceridemia    Discussed dietary changes, weight TG still elevated LFT also elevated in setting of above, so concern for fatty liver Will obtain RUQ ultrasound Pending  results, start statin drug       Relevant Medications   aspirin EC 81 MG tablet   METOPROLOL TARTRATE PO   Obesity   Routine general medical examination at a health care facility - Primary    CPE done, fasting labs reviewd Immunizations UTD       Other Visit Diagnoses    Subclinical hypothyroidism       Trial of synthroid 46mcg once a day, recheck symptoms in 6 weeks   Relevant Medications   METOPROLOL TARTRATE PO   levothyroxine (SYNTHROID) 25 MCG tablet   Chronic insomnia       Elevated LFTs          Note: This dictation was prepared with Dragon dictation along with smaller phrase technology. Any transcriptional errors that result from this process are unintentional.

## 2018-06-09 NOTE — Patient Instructions (Addendum)
Start synthroid in the morning Ultrasound to be done  Try the melatonin F/U 6 weeks

## 2018-06-11 ENCOUNTER — Encounter: Payer: Self-pay | Admitting: Family Medicine

## 2018-06-11 NOTE — Assessment & Plan Note (Signed)
CPE done, fasting labs reviewd Immunizations UTD

## 2018-06-11 NOTE — Assessment & Plan Note (Addendum)
Discussed dietary changes, weight TG still elevated LFT also elevated in setting of above, so concern for fatty liver Will obtain RUQ ultrasound Pending results, start statin drug

## 2018-06-11 NOTE — Assessment & Plan Note (Signed)
Controlled no changes 

## 2018-06-14 DIAGNOSIS — M9904 Segmental and somatic dysfunction of sacral region: Secondary | ICD-10-CM | POA: Diagnosis not present

## 2018-06-14 DIAGNOSIS — M545 Low back pain: Secondary | ICD-10-CM | POA: Diagnosis not present

## 2018-06-14 DIAGNOSIS — M9903 Segmental and somatic dysfunction of lumbar region: Secondary | ICD-10-CM | POA: Diagnosis not present

## 2018-06-17 DIAGNOSIS — M545 Low back pain: Secondary | ICD-10-CM | POA: Diagnosis not present

## 2018-06-17 DIAGNOSIS — M9903 Segmental and somatic dysfunction of lumbar region: Secondary | ICD-10-CM | POA: Diagnosis not present

## 2018-06-17 DIAGNOSIS — M9904 Segmental and somatic dysfunction of sacral region: Secondary | ICD-10-CM | POA: Diagnosis not present

## 2018-06-18 ENCOUNTER — Ambulatory Visit
Admission: RE | Admit: 2018-06-18 | Discharge: 2018-06-18 | Disposition: A | Discharge status: Home / Self Care | Payer: 59 | Source: Ambulatory Visit | Attending: Family Medicine | Admitting: Family Medicine

## 2018-06-18 DIAGNOSIS — R7989 Other specified abnormal findings of blood chemistry: Secondary | ICD-10-CM

## 2018-06-18 DIAGNOSIS — K760 Fatty (change of) liver, not elsewhere classified: Secondary | ICD-10-CM | POA: Diagnosis not present

## 2018-06-18 DIAGNOSIS — K8020 Calculus of gallbladder without cholecystitis without obstruction: Secondary | ICD-10-CM | POA: Diagnosis not present

## 2018-06-18 DIAGNOSIS — E781 Pure hyperglyceridemia: Secondary | ICD-10-CM

## 2018-06-18 DIAGNOSIS — R945 Abnormal results of liver function studies: Principal | ICD-10-CM

## 2018-07-08 ENCOUNTER — Other Ambulatory Visit: Payer: Self-pay | Admitting: *Deleted

## 2018-07-08 DIAGNOSIS — R945 Abnormal results of liver function studies: Secondary | ICD-10-CM

## 2018-07-08 DIAGNOSIS — K760 Fatty (change of) liver, not elsewhere classified: Secondary | ICD-10-CM

## 2018-07-08 DIAGNOSIS — K8020 Calculus of gallbladder without cholecystitis without obstruction: Secondary | ICD-10-CM

## 2018-07-08 DIAGNOSIS — R7989 Other specified abnormal findings of blood chemistry: Secondary | ICD-10-CM

## 2018-07-26 ENCOUNTER — Encounter: Payer: Self-pay | Admitting: Family Medicine

## 2018-07-26 ENCOUNTER — Other Ambulatory Visit: Payer: Self-pay

## 2018-07-26 ENCOUNTER — Ambulatory Visit: Payer: 59 | Admitting: Family Medicine

## 2018-07-26 VITALS — BP 130/82 | HR 80 | Temp 98.3°F | Resp 14 | Ht 73.0 in | Wt 250.0 lb

## 2018-07-26 DIAGNOSIS — E781 Pure hyperglyceridemia: Secondary | ICD-10-CM | POA: Diagnosis not present

## 2018-07-26 DIAGNOSIS — E039 Hypothyroidism, unspecified: Secondary | ICD-10-CM | POA: Diagnosis not present

## 2018-07-26 DIAGNOSIS — K219 Gastro-esophageal reflux disease without esophagitis: Secondary | ICD-10-CM

## 2018-07-26 DIAGNOSIS — K760 Fatty (change of) liver, not elsewhere classified: Secondary | ICD-10-CM

## 2018-07-26 DIAGNOSIS — E6609 Other obesity due to excess calories: Secondary | ICD-10-CM | POA: Diagnosis not present

## 2018-07-26 DIAGNOSIS — E038 Other specified hypothyroidism: Secondary | ICD-10-CM

## 2018-07-26 DIAGNOSIS — H6123 Impacted cerumen, bilateral: Secondary | ICD-10-CM

## 2018-07-26 DIAGNOSIS — Z6833 Body mass index (BMI) 33.0-33.9, adult: Secondary | ICD-10-CM

## 2018-07-26 MED ORDER — OMEPRAZOLE 40 MG PO CPDR: 40.0000 mg | DELAYED_RELEASE_CAPSULE | Freq: Every day | ORAL | 2 refills | Status: DC

## 2018-07-26 NOTE — Patient Instructions (Addendum)
Will move the GI appointment  No more than 2000mg  of tylenol  F/U 4 months

## 2018-07-26 NOTE — Assessment & Plan Note (Signed)
Improvement in his mood however he will have significant stressors with the long hours.  We will recheck his TSH today.  We did discuss that he can go back to work for a few weeks and see how he is handling things the stress is overwhelming and he may be willing to take anti-inflammatory will try something like Zoloft or Lexapro.  Prefer not to give him any sleeping aid as he does not have an adequate hours to commit to sleeping and will be drowsy with the medication the next day.  Again he does have the melatonin at home that he has not tried yet

## 2018-07-26 NOTE — Assessment & Plan Note (Signed)
Refilled omeprazole.

## 2018-07-26 NOTE — Assessment & Plan Note (Signed)
His work schedule he is unable to see gastroenterology unless he becomes an urgent issue.  No repeat his liver function test today.  They have made some dietary changes he is also on omega-3 at thousand milligrams twice a day

## 2018-07-26 NOTE — Progress Notes (Signed)
Subjective:    Patient ID: Raymond Hansen, male    DOB: 1970/12/05, 48 y.o.   MRN: [id number]  Patient presents for Follow-up (is fasting) Patient here for interim follow-up of medications.  At his physical exam the end of September Was found to have elevated triglycerides we discussed dietary changes he also had elevated liver function test.  Abdominal ultrasound was obtained which showed fatty liver.  He is taking omega-3 at thousand milligrams twice a day.  Was also referred to gastroenterology however he has not had an appointment set yet.  Incidentally he also had gallstones on his ultrasound-he does not have any cholecystitis symptoms.  Thyroid function studies studies also showed subclinical hypothyroidism he was having difficulty with sleep as well as his stressors.  He also has a history of atrial fibrillation and had abnormal TSH at that time.  Decided to put him on low dose of levothyroxine and see how he responded. States he can tell a little bit of a difference with his mood his wife does not think he is is irritable.  However he has been out of work because of the holidays and having a URI he has been working from home.  This is his busy season or he works 7 days a week 70 to 80 hours until March and he is concerned that his stressors will likely flareup again but does not want to go on any medication at this time.  Chronic insomnia he was going to try melatonin over-the-counter.  Sleep has been fine so he has not tried some melatonin but notes that he has not been to be able to sleep very much when he goes back to work states that he typically sleeps about 5 hours or less  He is due for repeat liver function as well as TSH  Wax in both ears   Review Of Systems:  GEN- denies fatigue, fever, weight loss,weakness, recent illness HEENT- denies eye drainage, change in vision, nasal discharge, CVS- denies chest pain, palpitations RESP- denies SOB, cough, wheeze ABD- denies N/V,  change in stools, abd pain GU- denies dysuria, hematuria, dribbling, incontinence MSK- denies joint pain, muscle aches, injury Neuro- denies headache, dizziness, syncope, seizure activity       Objective:    BP 130/82   Pulse 80   Temp 98.3 F (36.8 C) (Oral)   Resp 14   Ht 6\' 1"  (1.854 m)   Wt 250 lb (113.4 kg)   SpO2 95%   BMI 32.98 kg/m  GEN- NAD, alert and oriented x3 HEENT- PERRL, EOMI, non injected sclera, pink conjunctiva, MMM, oropharynx clear, TM bilat cerumen impaction  Neck- Supple, no thyromegaly CVS- RRR, no murmur RESP-CTAB Psych- normal affect and mood  EXT- No edema Pulses- Radial, 2+        Assessment & Plan:      Problem List Items Addressed This Visit      Unprioritized   Fatty liver disease, nonalcoholic    His work schedule he is unable to see gastroenterology unless he becomes an urgent issue.  No repeat his liver function test today.  They have made some dietary changes he is also on omega-3 at thousand milligrams twice a day      Relevant Orders   Comprehensive metabolic panel   GERD (gastroesophageal reflux disease)    Refilled omeprazole      Relevant Medications   omeprazole (PRILOSEC) 40 MG capsule   Hypertriglyceridemia   Relevant Orders   Comprehensive metabolic  panel   Obesity - Primary   Subclinical hypothyroidism    Improvement in his mood however he will have significant stressors with the long hours.  We will recheck his TSH today.  We did discuss that he can go back to work for a few weeks and see how he is handling things the stress is overwhelming and he may be willing to take anti-inflammatory will try something like Zoloft or Lexapro.  Prefer not to give him any sleeping aid as he does not have an adequate hours to commit to sleeping and will be drowsy with the medication the next day.  Again he does have the melatonin at home that he has not tried yet      Relevant Orders   TSH    Other Visit Diagnoses     Bilateral impacted cerumen       s/p irrigation at bedside      Note: This dictation was prepared with Dragon dictation along with smaller phrase technology. Any transcriptional errors that result from this process are unintentional.

## 2018-07-27 LAB — COMPREHENSIVE METABOLIC PANEL
AG RATIO: 2 (calc) (ref 1.0–2.5)
ALBUMIN MSPROF: 4.7 g/dL (ref 3.6–5.1)
ALKALINE PHOSPHATASE (APISO): 68 U/L (ref 40–115)
ALT: 57 U/L — ABNORMAL HIGH (ref 9–46)
AST: 34 U/L (ref 10–40)
BILIRUBIN TOTAL: 0.6 mg/dL (ref 0.2–1.2)
BUN: 9 mg/dL (ref 7–25)
CHLORIDE: 99 mmol/L (ref 98–110)
CO2: 29 mmol/L (ref 20–32)
CREATININE: 0.94 mg/dL (ref 0.60–1.35)
Calcium: 9.5 mg/dL (ref 8.6–10.3)
GLOBULIN: 2.4 g/dL (ref 1.9–3.7)
Glucose, Bld: 95 mg/dL (ref 65–99)
POTASSIUM: 3.5 mmol/L (ref 3.5–5.3)
Sodium: 142 mmol/L (ref 135–146)
Total Protein: 7.1 g/dL (ref 6.1–8.1)

## 2018-07-27 LAB — TSH: TSH: 4.74 mIU/L — ABNORMAL HIGH (ref 0.40–4.50)

## 2018-07-29 ENCOUNTER — Other Ambulatory Visit: Payer: Self-pay | Admitting: *Deleted

## 2018-07-29 MED ORDER — LEVOTHYROXINE SODIUM 50 MCG PO TABS: 50.0000 ug | ORAL_TABLET | Freq: Every day | ORAL | 3 refills | Status: DC

## 2018-08-02 ENCOUNTER — Other Ambulatory Visit: Payer: Self-pay | Admitting: *Deleted

## 2018-08-02 MED ORDER — LEVOTHYROXINE SODIUM 50 MCG PO TABS: 50.0000 ug | ORAL_TABLET | Freq: Every day | ORAL | 3 refills | Status: DC

## 2018-09-02 DIAGNOSIS — D2261 Melanocytic nevi of right upper limb, including shoulder: Secondary | ICD-10-CM | POA: Diagnosis not present

## 2018-09-02 DIAGNOSIS — D2262 Melanocytic nevi of left upper limb, including shoulder: Secondary | ICD-10-CM | POA: Diagnosis not present

## 2018-09-02 DIAGNOSIS — Z85820 Personal history of malignant melanoma of skin: Secondary | ICD-10-CM | POA: Diagnosis not present

## 2018-09-09 ENCOUNTER — Encounter: Payer: Self-pay | Admitting: Family Medicine

## 2018-12-25 IMAGING — US US ABDOMEN LIMITED
1 series · 14 of 25 positions shown · non-contrast
Comparison: None.

CLINICAL DATA: 47-year-old male with elevated LFTs.

EXAM:
ULTRASOUND ABDOMEN LIMITED RIGHT UPPER QUADRANT

[Series 1: us abdomen limited · 0.23mm/px · 14 of 43 slices shown]
[im 1/43]
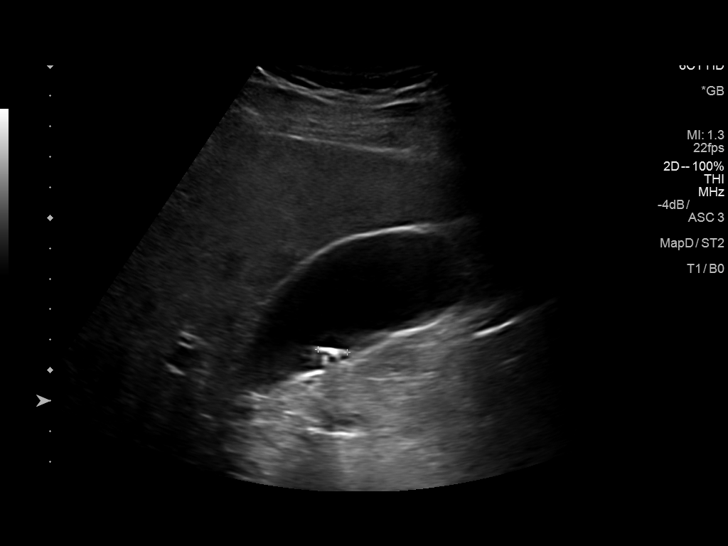
[im 4/43]
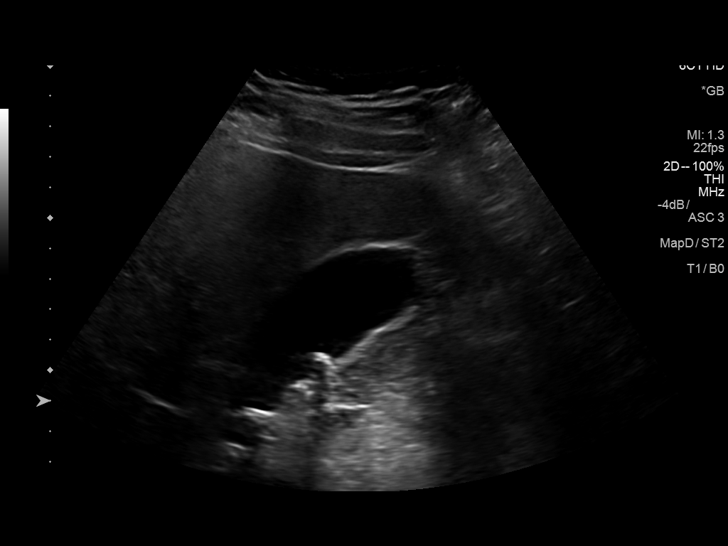
[im 8/43]
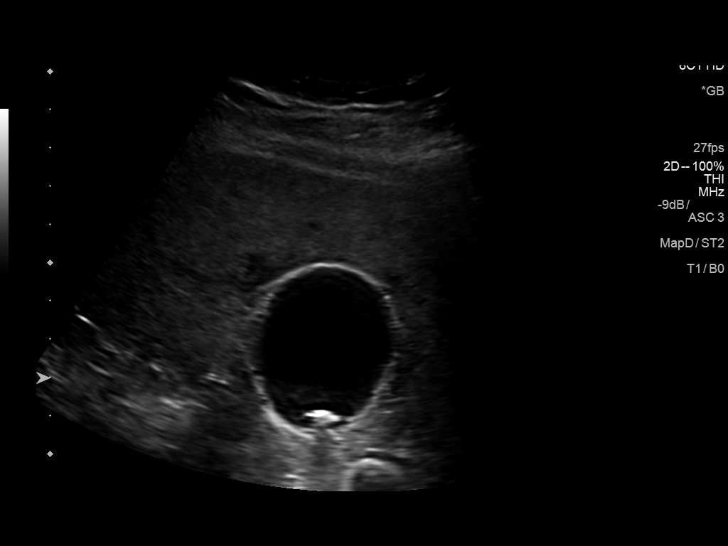
[im 11/43]
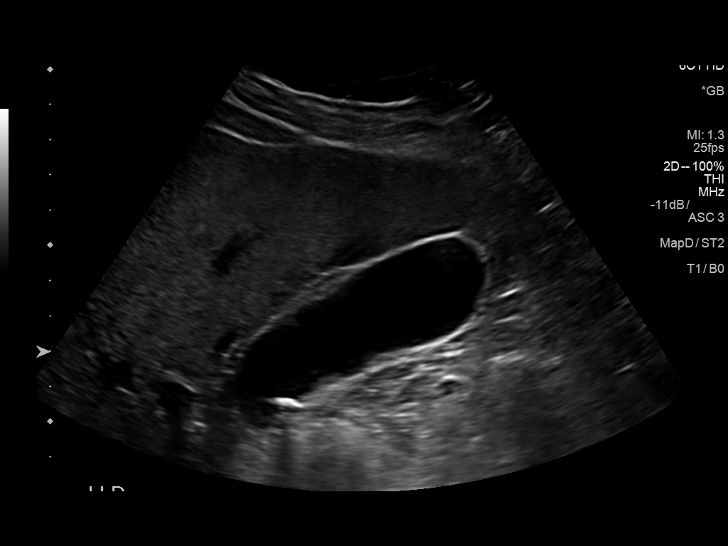
[im 15/43]
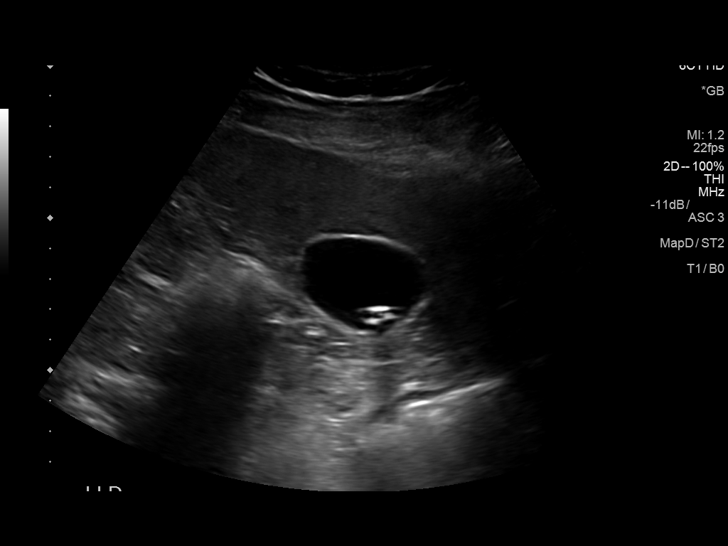
[im 16/43]
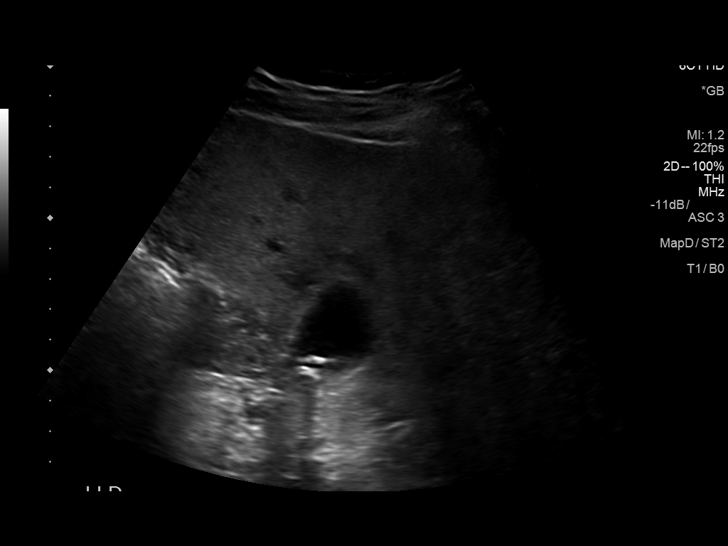
[im 20/43]
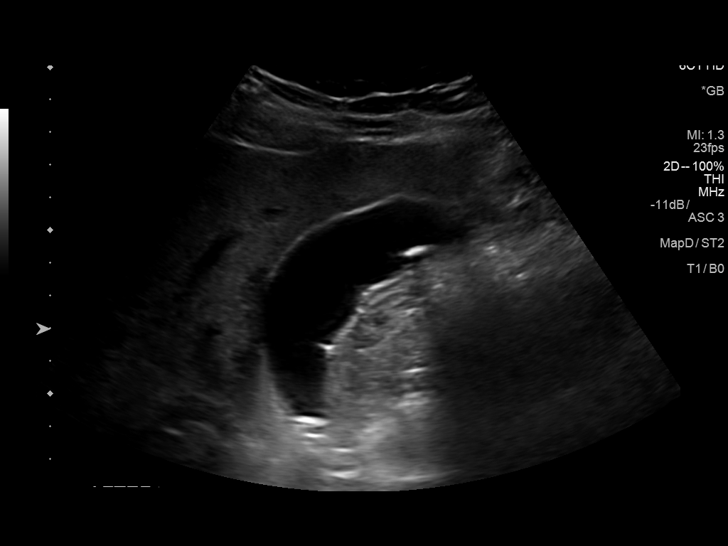
[im 23/43]
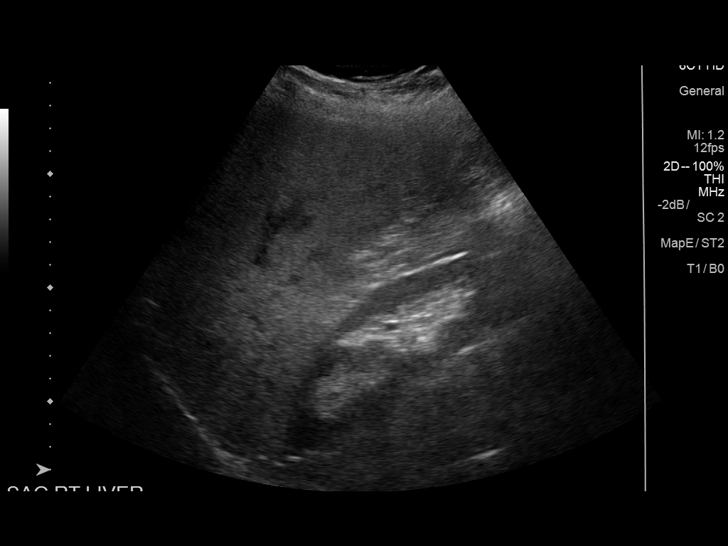
[im 27/43]
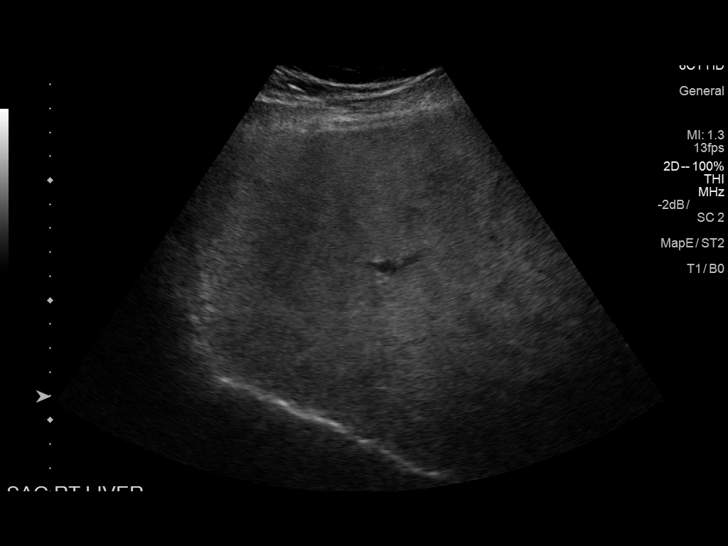
[im 29/43]
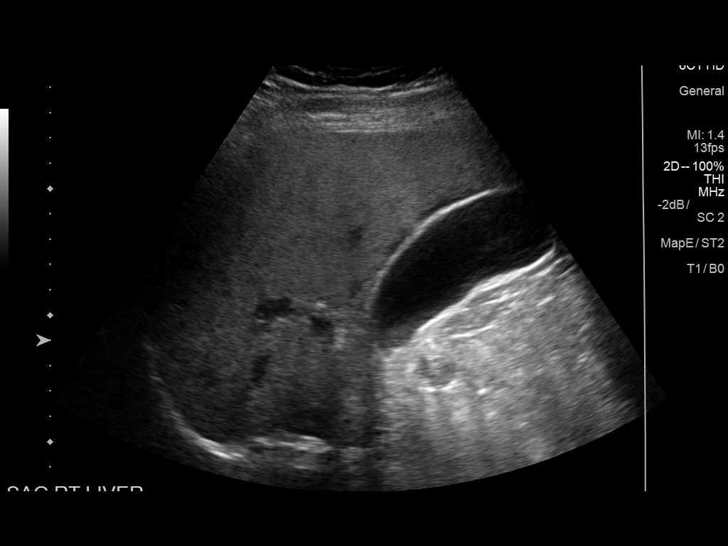
[im 32/43]
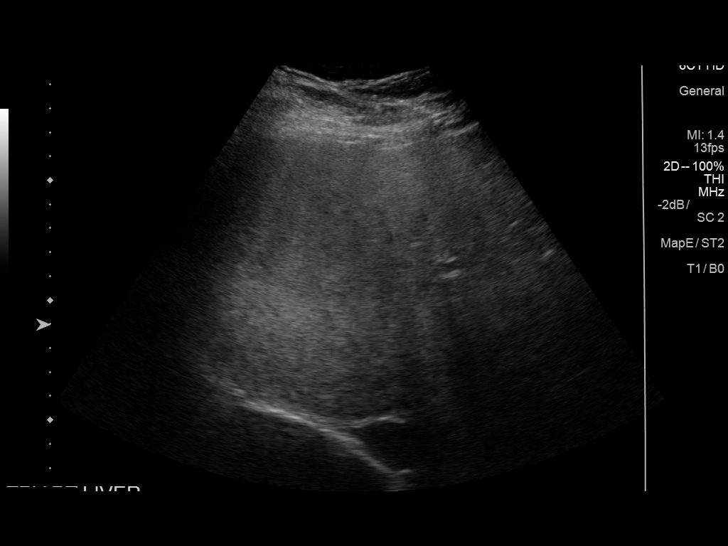
[im 36/43]
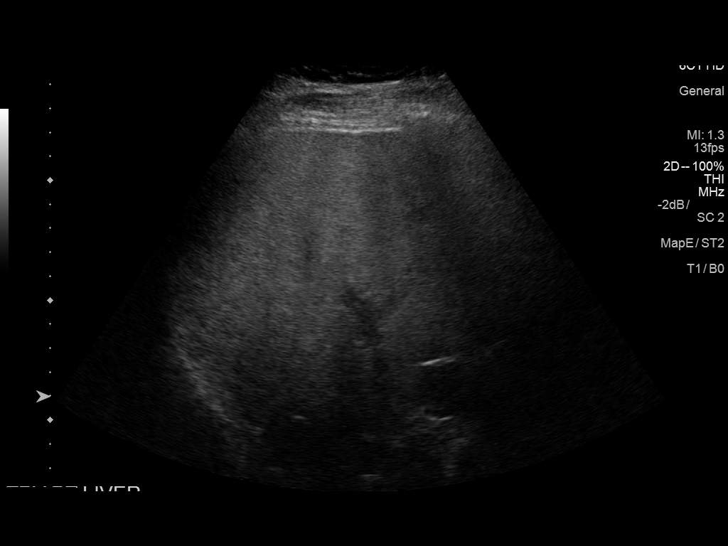
[im 39/43]
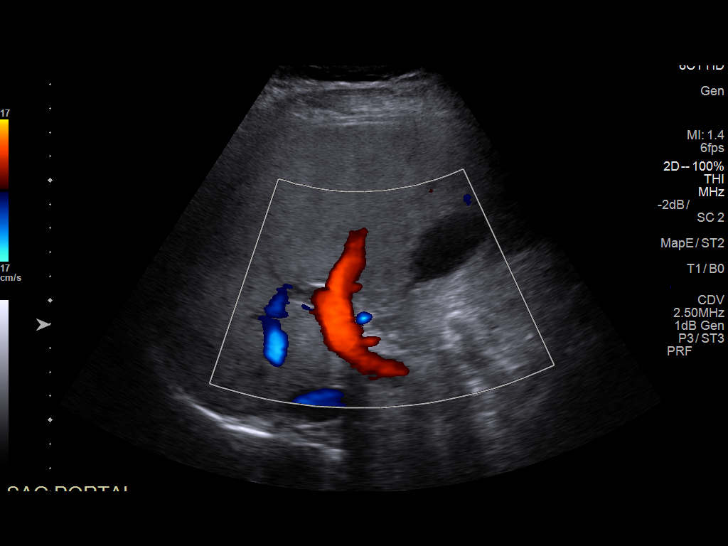
[im 43/43]
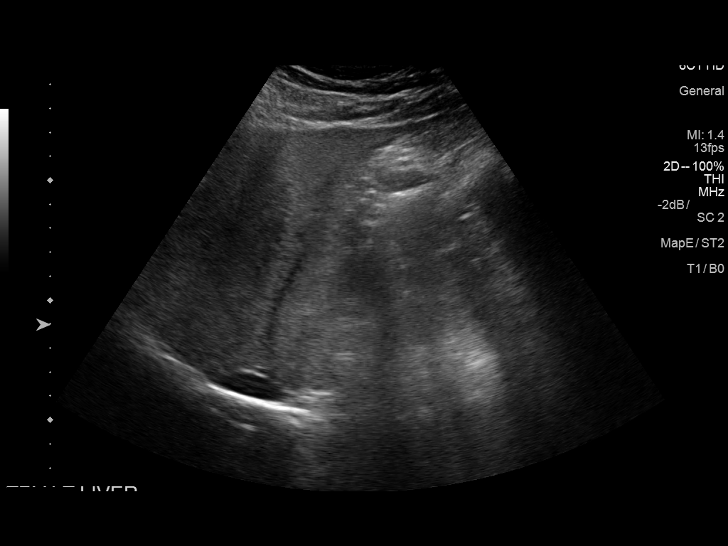

[14 of 25 positions shown; findings below may reference images not displayed]

FINDINGS: Gallbladder:

Small mobile gallstones are identified, the largest measuring 1 cm.
No gallbladder wall thickening, pericholecystic fluid or sonographic
Murphy sign noted.

Common bile duct:

Diameter: 3 mm.  No intrahepatic or extrahepatic biliary dilatation.

Liver:

Diffusely increased hepatic echogenicity noted compatible with
hepatic steatosis. No focal hepatic abnormalities are identified.
Portal vein is patent on color Doppler imaging with normal direction
of blood flow towards the liver.
IMPRESSION: 1. Hepatic steatosis.  No focal hepatic abnormalities.
2. Cholelithiasis without evidence of acute cholecystitis.

## 2019-03-30 ENCOUNTER — Other Ambulatory Visit: Payer: Self-pay | Admitting: Family Medicine

## 2019-03-30 DIAGNOSIS — K219 Gastro-esophageal reflux disease without esophagitis: Secondary | ICD-10-CM

## 2019-05-14 ENCOUNTER — Other Ambulatory Visit: Payer: Self-pay | Admitting: Family Medicine

## 2019-05-30 ENCOUNTER — Other Ambulatory Visit: Payer: Self-pay | Admitting: Family Medicine

## 2019-07-13 ENCOUNTER — Other Ambulatory Visit: Payer: Self-pay

## 2019-07-13 ENCOUNTER — Ambulatory Visit (INDEPENDENT_AMBULATORY_CARE_PROVIDER_SITE_OTHER): Payer: 59 | Admitting: Family Medicine

## 2019-07-13 ENCOUNTER — Encounter: Payer: Self-pay | Admitting: Family Medicine

## 2019-07-13 DIAGNOSIS — E039 Hypothyroidism, unspecified: Secondary | ICD-10-CM

## 2019-07-13 DIAGNOSIS — E781 Pure hyperglyceridemia: Secondary | ICD-10-CM | POA: Diagnosis not present

## 2019-07-13 DIAGNOSIS — K219 Gastro-esophageal reflux disease without esophagitis: Secondary | ICD-10-CM | POA: Diagnosis not present

## 2019-07-13 DIAGNOSIS — E038 Other specified hypothyroidism: Secondary | ICD-10-CM

## 2019-07-13 DIAGNOSIS — I480 Paroxysmal atrial fibrillation: Secondary | ICD-10-CM

## 2019-07-13 DIAGNOSIS — K760 Fatty (change of) liver, not elsewhere classified: Secondary | ICD-10-CM

## 2019-07-13 DIAGNOSIS — I10 Essential (primary) hypertension: Secondary | ICD-10-CM

## 2019-07-13 MED ORDER — METOPROLOL TARTRATE 25 MG PO TABS: ORAL_TABLET | ORAL | 0 refills | Status: DC

## 2019-07-13 MED ORDER — OMEPRAZOLE 40 MG PO CPDR: 40.0000 mg | DELAYED_RELEASE_CAPSULE | Freq: Every day | ORAL | 3 refills | Status: DC

## 2019-07-13 NOTE — Progress Notes (Signed)
Virtual Visit via Telephone Note  I connected with Raymond Hansen on 07/13/19 at 8:38am by telephone and verified that I am speaking with the correct person using two identifiers.       Pt location: at home   Physician location:  In office, [facility], Vic Blackbird MD     On call: patient, pt wife  and physician      I discussed the limitations, risks, security and privacy concerns of performing an evaluation and management service by telephone and the availability of in person appointments. I also discussed with the patient that there may be a patient responsible charge related to this service. The patient expressed understanding and agreed to proceed.   History of Present Illness:  Last episode of A fib was in July, 1 year from his last episode, he took the remainder of his metoprolol as directed by cardiology and this resolved the issue without needing emergent care. No further chest pain/ palpitations/SOB episodes since then     Thyroid - he cant tell much difference with the supplement, but due for recheck on hormone. He is taking consistently   Elevated liver function in setting of hyperlipidemia/high TG, needs repeat labs, he has not taken omega 3 recently but has changed diet and intentionally lost weight     Vitals at home  : Weight Currently  239lbs /  121/76  Hr 76   GERD- omeprazole  40mg  controls symptoms  He does tend to need most days     Observations/Objective: Unable to observe, no acute distress noted on phone   Assessment and Plan: Hypertriglyceridemia/ fatty liver disease  he will come in for fasting labs and reassess lipids and LFT  Hypothyroidism- in setting of the elevated bp in past and PAF, will continue thyroid supplement, but needs updated labs   GERD- continue omeprazole   Follow Up Instructions:    I discussed the assessment and treatment plan with the patient. The patient was provided an opportunity to ask questions and all  were answered. The patient agreed with the plan and demonstrated an understanding of the instructions.   The patient was advised to call back or seek an in-person evaluation if the symptoms worsen or if the condition fails to improve as anticipated.  I provided  76minutes of non-face-to-face time during this encounter. End Time 8:54am   Vic Blackbird, MD

## 2019-07-22 ENCOUNTER — Other Ambulatory Visit: Payer: Self-pay | Admitting: Family Medicine

## 2019-07-29 ENCOUNTER — Other Ambulatory Visit: Payer: Self-pay

## 2019-07-29 ENCOUNTER — Other Ambulatory Visit: Payer: 59

## 2019-07-29 DIAGNOSIS — E781 Pure hyperglyceridemia: Secondary | ICD-10-CM

## 2019-07-29 DIAGNOSIS — E038 Other specified hypothyroidism: Secondary | ICD-10-CM

## 2019-07-29 DIAGNOSIS — E039 Hypothyroidism, unspecified: Secondary | ICD-10-CM

## 2019-07-29 DIAGNOSIS — K760 Fatty (change of) liver, not elsewhere classified: Secondary | ICD-10-CM

## 2019-07-29 DIAGNOSIS — I10 Essential (primary) hypertension: Secondary | ICD-10-CM

## 2019-07-30 LAB — CBC WITH DIFFERENTIAL/PLATELET
Absolute Monocytes: 612 cells/uL (ref 200–950)
Basophils Absolute: 60 cells/uL (ref 0–200)
Basophils Relative: 1 %
Eosinophils Absolute: 150 cells/uL (ref 15–500)
Eosinophils Relative: 2.5 %
HCT: 47.5 % (ref 38.5–50.0)
Hemoglobin: 17 g/dL (ref 13.2–17.1)
Lymphs Abs: 1326 cells/uL (ref 850–3900)
MCH: 33.3 pg — ABNORMAL HIGH (ref 27.0–33.0)
MCHC: 35.8 g/dL (ref 32.0–36.0)
MCV: 93.1 fL (ref 80.0–100.0)
MPV: 9.1 fL (ref 7.5–12.5)
Monocytes Relative: 10.2 %
Neutro Abs: 3852 cells/uL (ref 1500–7800)
Neutrophils Relative %: 64.2 %
Platelets: 236 10*3/uL (ref 140–400)
RBC: 5.1 10*6/uL (ref 4.20–5.80)
RDW: 12.8 % (ref 11.0–15.0)
Total Lymphocyte: 22.1 %
WBC: 6 10*3/uL (ref 3.8–10.8)

## 2019-07-30 LAB — COMPREHENSIVE METABOLIC PANEL
AG Ratio: 2 (calc) (ref 1.0–2.5)
ALT: 47 U/L — ABNORMAL HIGH (ref 9–46)
AST: 30 U/L (ref 10–40)
Albumin: 4.6 g/dL (ref 3.6–5.1)
Alkaline phosphatase (APISO): 52 U/L (ref 36–130)
BUN: 11 mg/dL (ref 7–25)
CO2: 31 mmol/L (ref 20–32)
Calcium: 9.8 mg/dL (ref 8.6–10.3)
Chloride: 100 mmol/L (ref 98–110)
Creat: 0.96 mg/dL (ref 0.60–1.35)
Globulin: 2.3 g/dL (calc) (ref 1.9–3.7)
Glucose, Bld: 103 mg/dL — ABNORMAL HIGH (ref 65–99)
Potassium: 3.9 mmol/L (ref 3.5–5.3)
Sodium: 142 mmol/L (ref 135–146)
Total Bilirubin: 0.8 mg/dL (ref 0.2–1.2)
Total Protein: 6.9 g/dL (ref 6.1–8.1)

## 2019-07-30 LAB — TSH: TSH: 2.53 mIU/L (ref 0.40–4.50)

## 2019-07-30 LAB — T3, FREE: T3, Free: 3.7 pg/mL (ref 2.3–4.2)

## 2019-07-30 LAB — LIPID PANEL
Cholesterol: 201 mg/dL — ABNORMAL HIGH (ref <200)
HDL: 42 mg/dL (ref >=40)
LDL Cholesterol (Calc): 124 mg/dL (calc) — ABNORMAL HIGH
Non-HDL Cholesterol (Calc): 159 mg/dL (calc) — ABNORMAL HIGH (ref <130)
Total CHOL/HDL Ratio: 4.8 (calc) (ref <5.0)
Triglycerides: 248 mg/dL — ABNORMAL HIGH (ref <150)

## 2019-07-30 LAB — T4, FREE: Free T4: 1.4 ng/dL (ref 0.8–1.8)

## 2019-08-04 ENCOUNTER — Other Ambulatory Visit: Payer: Self-pay | Admitting: *Deleted

## 2019-08-04 DIAGNOSIS — E781 Pure hyperglyceridemia: Secondary | ICD-10-CM

## 2019-08-04 DIAGNOSIS — K760 Fatty (change of) liver, not elsewhere classified: Secondary | ICD-10-CM

## 2019-08-04 DIAGNOSIS — R7989 Other specified abnormal findings of blood chemistry: Secondary | ICD-10-CM

## 2019-08-04 MED ORDER — ATORVASTATIN CALCIUM 20 MG PO TABS: 20.0000 mg | ORAL_TABLET | Freq: Every day | ORAL | 3 refills | Status: DC

## 2019-10-16 IMAGING — DX DG CHEST 1V PORT
1 series · 1 of 1 positions shown · non-contrast
Comparison: None.

CLINICAL DATA: Rapid ventricular rhythm

EXAM:
PORTABLE CHEST 1 VIEW

[chest ap]
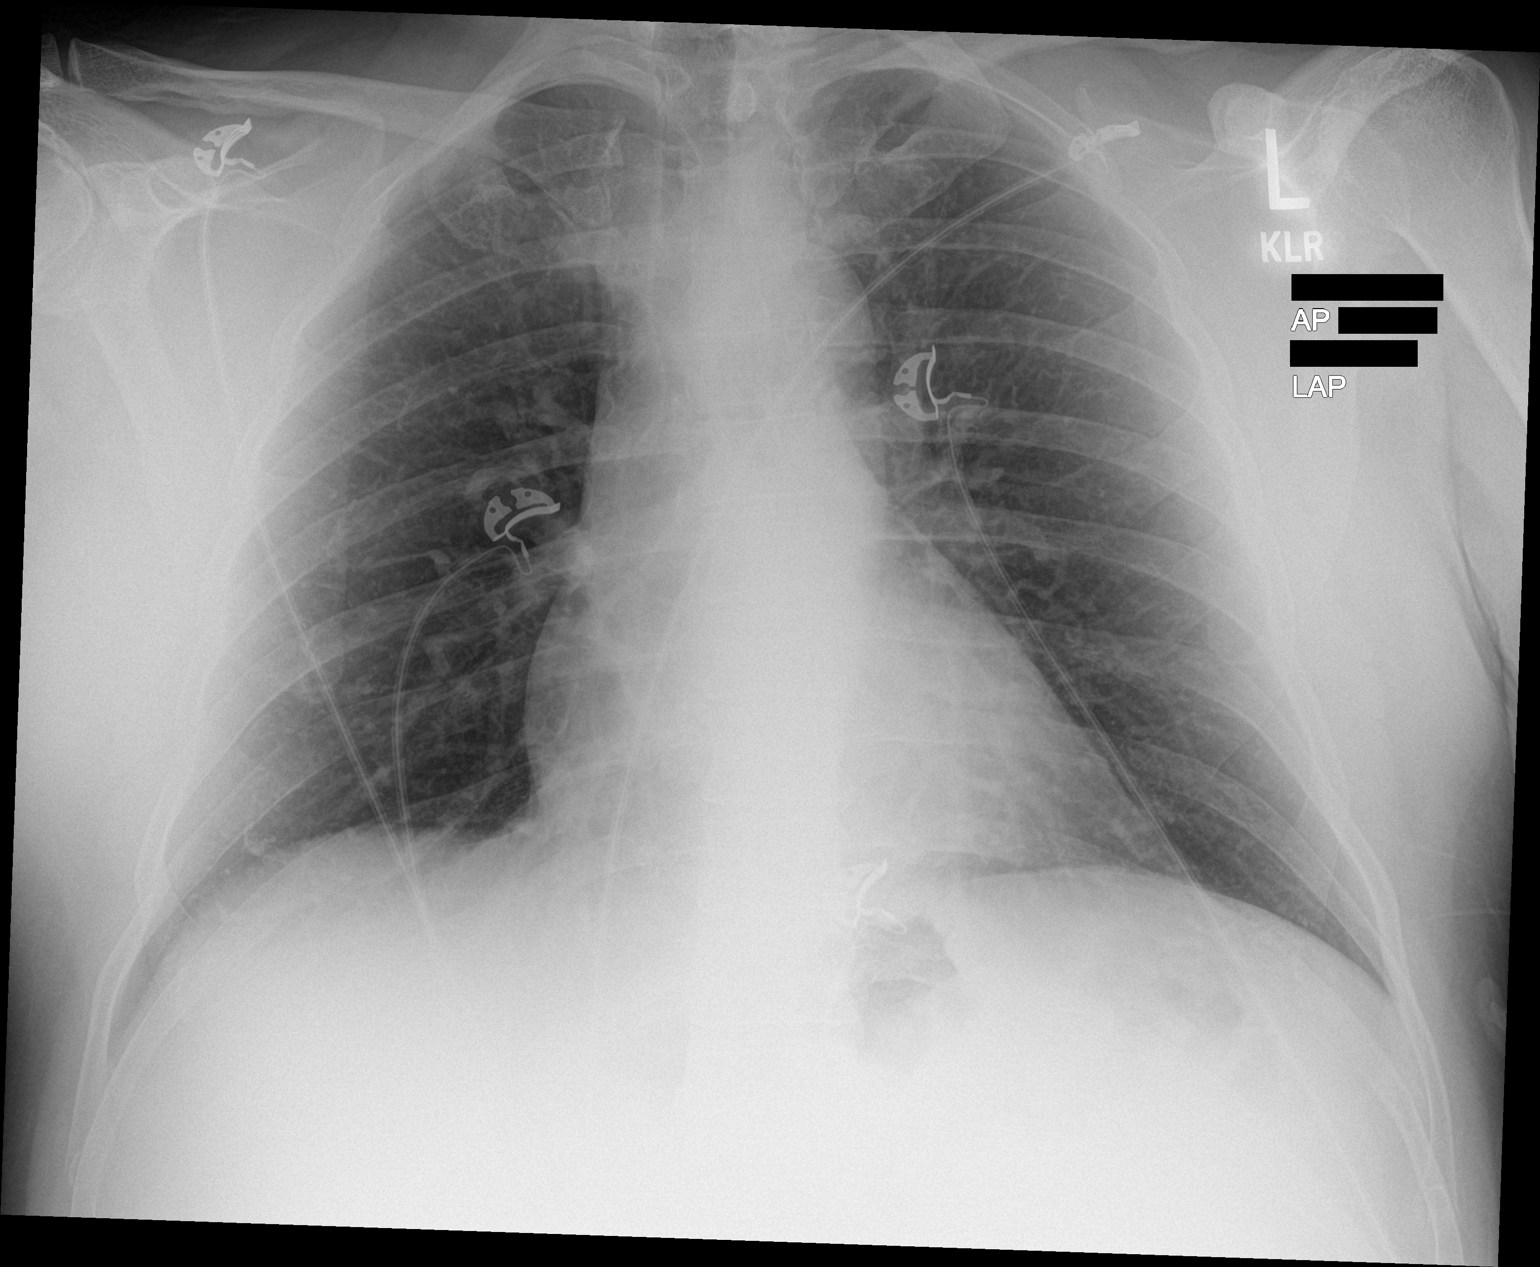

[1 of 1 positions shown; findings below may reference images not displayed]

FINDINGS: Normal heart size. Normal mediastinal contour. No pneumothorax. No
pleural effusion. Lungs appear clear, with no acute consolidative
airspace disease and no pulmonary edema.
IMPRESSION: No active disease.

## 2020-06-10 ENCOUNTER — Other Ambulatory Visit: Payer: Self-pay | Admitting: Family Medicine

## 2020-06-10 DIAGNOSIS — K219 Gastro-esophageal reflux disease without esophagitis: Secondary | ICD-10-CM

## 2020-06-17 ENCOUNTER — Other Ambulatory Visit: Payer: Self-pay | Admitting: Family Medicine

## 2020-07-08 ENCOUNTER — Other Ambulatory Visit: Payer: Self-pay | Admitting: Family Medicine

## 2020-07-08 DIAGNOSIS — K219 Gastro-esophageal reflux disease without esophagitis: Secondary | ICD-10-CM

## 2020-09-14 ENCOUNTER — Other Ambulatory Visit: Payer: Self-pay | Admitting: Family Medicine

## 2020-09-14 DIAGNOSIS — K219 Gastro-esophageal reflux disease without esophagitis: Secondary | ICD-10-CM

## 2021-06-14 DIAGNOSIS — E039 Hypothyroidism, unspecified: Secondary | ICD-10-CM | POA: Diagnosis not present

## 2021-06-14 DIAGNOSIS — I10 Essential (primary) hypertension: Secondary | ICD-10-CM | POA: Diagnosis not present

## 2021-06-20 DIAGNOSIS — R945 Abnormal results of liver function studies: Secondary | ICD-10-CM | POA: Diagnosis not present

## 2021-06-20 DIAGNOSIS — I10 Essential (primary) hypertension: Secondary | ICD-10-CM | POA: Diagnosis not present

## 2021-06-20 DIAGNOSIS — E782 Mixed hyperlipidemia: Secondary | ICD-10-CM | POA: Diagnosis not present

## 2021-06-20 DIAGNOSIS — E039 Hypothyroidism, unspecified: Secondary | ICD-10-CM | POA: Diagnosis not present

## 2022-01-06 ENCOUNTER — Encounter: Payer: Self-pay | Admitting: *Deleted

## 2022-01-29 ENCOUNTER — Encounter: Payer: Self-pay | Admitting: *Deleted

## 2022-01-29 NOTE — Patient Instructions (Signed)
  Procedure: Colonoscoy  Estimated body mass index is 32.98 kg/m as calculated from the following:   Height as of this encounter: '6\' 1"'$  (1.854 m).   Weight as of this encounter: 250 lb (113.4 kg).   Have you had a colonoscopy before?  no  Do you have family history of colon cancer  no  Do you have a family history of polyps? no  Do you have a history colorectal cancer?   no  Are you diabetic?  no  Do you have a prosthetic or mechanical heart valve? no  Do you have a pacemaker/defibrillator?   no  Have you had endocarditis/atrial fibrillation?  Yes, afib  Do you use supplemental oxygen/CPAP?  no  Have you had joint replacement within the last 12 months?  no  Do you tend to be constipated or have to use laxatives?  no   Do you have history of alcohol use? yes  Do you have history or are you using drugs? If yes, what do are you  using?  no  Have you ever had a stroke/heart attack?  no  Have you ever had a heart or other vascular stent placed,?no  Do you take weight loss medication? no  Do you take any blood-thinning medications such as: (Plavix, aspirin, Coumadin, Aggrenox, Brilinta, Xarelto, Eliquis, Pradaxa, Savaysa or Effient) no  If yes we need the name, milligram, dosage and who is prescribing doctor:               Current Outpatient Medications  Medication Sig Dispense Refill   Ascorbic Acid (VITA-C PO) Take by mouth daily.     cetirizine (ZYRTEC) 10 MG tablet Take 10 mg by mouth daily.     cholecalciferol (VITAMIN D3) 25 MCG (1000 UNIT) tablet Take 1,000 Units by mouth daily.     fenofibrate 160 MG tablet Take 160 mg by mouth daily.     lisinopril (ZESTRIL) 20 MG tablet Take 20 mg by mouth daily.     omeprazole (PRILOSEC) 40 MG capsule TAKE 1 CAPSULE BY MOUTH  DAILY 90 capsule 0   No current facility-administered medications for this visit.    Allergies  Allergen Reactions   Codeine     Since childhood   Tricor [Fenofibrate]     Body aches

## 2022-02-05 NOTE — Progress Notes (Signed)
Ok to schedule. ASA 3 (due to regular etoh consumption).

## 2022-02-06 ENCOUNTER — Encounter: Payer: Self-pay | Admitting: *Deleted

## 2022-02-06 MED ORDER — CLENPIQ 10-3.5-12 MG-GM -GM/175ML PO SOLN: 1.0000 | ORAL | 0 refills | Status: DC

## 2022-02-06 NOTE — Progress Notes (Signed)
Spoke with pt. Scheduled with Dr. Abbey Chatters 8/17 at 7:30am. Aware will mail instructions/pre-op. Rx sent in.

## 2022-02-06 NOTE — Progress Notes (Signed)
PA approved via [facility]. Auth# [id number], DOS: 02/27/22-05/28/22

## 2022-02-21 NOTE — Patient Instructions (Signed)
Raymond Hansen  02/21/2022     '@PREFPERIOPPHARMACY'$ @   Your procedure is scheduled on  02/27/2022.   Report to [facility] at  0600 A.M.   Call this number if you have problems the morning of surgery:  [phone]   Remember:        Follow the diet and prep instructions given to you by the office.    Take these medicines the morning of surgery with A SIP OF WATER                               lopressor, prilosec.     Do not wear jewelry, make-up or nail polish.  Do not wear lotions, powders, or perfumes, or deodorant.  Do not shave 48 hours prior to surgery.  Men may shave face and neck.  Do not bring valuables to the hospital.  [facility] is not responsible for any belongings or valuables.  Contacts, dentures or bridgework may not be worn into surgery.  Leave your suitcase in the car.  After surgery it may be brought to your room.  For patients admitted to the hospital, discharge time will be determined by your treatment team.  Patients discharged the day of surgery will not be allowed to drive home and must have someone with them for 24 hours.    Special instructions:  DO NOT smoke tobacco or vape for 24 hours before your procedure.  Please read over the following fact sheets that you were given. Anesthesia Post-op Instructions and Care and Recovery After Surgery      Colonoscopy, Adult, Care After The following information offers guidance on how to care for yourself after your procedure. Your health care provider may also give you more specific instructions. If you have problems or questions, contact your health care provider. What can I expect after the procedure? After the procedure, it is common to have: A small amount of blood in your stool for 24 hours after the procedure. Some gas. Mild cramping or bloating of your abdomen. Follow these instructions at home: Eating and drinking  Drink enough fluid to keep your urine pale yellow. Follow  instructions from your health care provider about eating or drinking restrictions. Resume your normal diet as told by your health care provider. Avoid heavy or fried foods that are hard to digest. Activity Rest as told by your health care provider. Avoid sitting for a long time without moving. Get up to take short walks every 1-2 hours. This is important to improve blood flow and breathing. Ask for help if you feel weak or unsteady. Return to your normal activities as told by your health care provider. Ask your health care provider what activities are safe for you. Managing cramping and bloating  Try walking around when you have cramps or feel bloated. If directed, apply heat to your abdomen as told by your health care provider. Use the heat source that your health care provider recommends, such as a moist heat pack or a heating pad. Place a towel between your skin and the heat source. Leave the heat on for 20-30 minutes. Remove the heat if your skin turns bright red. This is especially important if you are unable to feel pain, heat, or cold. You have a greater risk of getting burned. General instructions If you were given a sedative during the procedure, it can affect you for several hours. Do  not drive or operate machinery until your health care provider says that it is safe. For the first 24 hours after the procedure: Do not sign important documents. Do not drink alcohol. Do your regular daily activities at a slower pace than normal. Eat soft foods that are easy to digest. Take over-the-counter and prescription medicines only as told by your health care provider. Keep all follow-up visits. This is important. Contact a health care provider if: You have blood in your stool 2-3 days after the procedure. Get help right away if: You have more than a small spotting of blood in your stool. You have large blood clots in your stool. You have swelling of your abdomen. You have nausea or  vomiting. You have a fever. You have increasing pain in your abdomen that is not relieved with medicine. These symptoms may be an emergency. Get help right away. Call 911. Do not wait to see if the symptoms will go away. Do not drive yourself to the hospital. Summary After the procedure, it is common to have a small amount of blood in your stool. You may also have mild cramping and bloating of your abdomen. If you were given a sedative during the procedure, it can affect you for several hours. Do not drive or operate machinery until your health care provider says that it is safe. Get help right away if you have a lot of blood in your stool, nausea or vomiting, a fever, or increased pain in your abdomen. This information is not intended to replace advice given to you by your health care provider. Make sure you discuss any questions you have with your health care provider. Document Revised: 02/20/2021 Document Reviewed: 02/20/2021 Elsevier Patient Education  [place] After This sheet gives you information about how to care for yourself after your procedure. Your health care provider may also give you more specific instructions. If you have problems or questions, contact your health care provider. What can I expect after the procedure? After the procedure, it is common to have: Tiredness. Forgetfulness about what happened after the procedure. Impaired judgment for important decisions. Nausea or vomiting. Some difficulty with balance. Follow these instructions at home: For the time period you were told by your health care provider:     Rest as needed. Do not participate in activities where you could fall or become injured. Do not drive or use machinery. Do not drink alcohol. Do not take sleeping pills or medicines that cause drowsiness. Do not make important decisions or sign legal documents. Do not take care of children on your own. Eating  and drinking Follow the diet that is recommended by your health care provider. Drink enough fluid to keep your urine pale yellow. If you vomit: Drink water, juice, or soup when you can drink without vomiting. Make sure you have little or no nausea before eating solid foods. General instructions Have a responsible adult stay with you for the time you are told. It is important to have someone help care for you until you are awake and alert. Take over-the-counter and prescription medicines only as told by your health care provider. If you have sleep apnea, surgery and certain medicines can increase your risk for breathing problems. Follow instructions from your health care provider about wearing your sleep device: Anytime you are sleeping, including during daytime naps. While taking prescription pain medicines, sleeping medicines, or medicines that make you drowsy. Avoid smoking. Keep all follow-up visits as  told by your health care provider. This is important. Contact a health care provider if: You keep feeling nauseous or you keep vomiting. You feel light-headed. You are still sleepy or having trouble with balance after 24 hours. You develop a rash. You have a fever. You have redness or swelling around the IV site. Get help right away if: You have trouble breathing. You have new-onset confusion at home. Summary For several hours after your procedure, you may feel tired. You may also be forgetful and have poor judgment. Have a responsible adult stay with you for the time you are told. It is important to have someone help care for you until you are awake and alert. Rest as told. Do not drive or operate machinery. Do not drink alcohol or take sleeping pills. Get help right away if you have trouble breathing, or if you suddenly become confused. This information is not intended to replace advice given to you by your health care provider. Make sure you discuss any questions you have with your  health care provider. Document Revised: 06/04/2021 Document Reviewed: 06/02/2019 Elsevier Patient Education  [place].

## 2022-02-25 ENCOUNTER — Encounter (HOSPITAL_COMMUNITY): Payer: Self-pay

## 2022-02-25 ENCOUNTER — Encounter (HOSPITAL_COMMUNITY)
Admission: RE | Admit: 2022-02-25 | Discharge: 2022-02-25 | Disposition: A | Discharge status: Home / Self Care | Payer: No Typology Code available for payment source | Source: Ambulatory Visit | Attending: Internal Medicine | Admitting: Internal Medicine

## 2022-02-25 VITALS — BP 134/91 | HR 77 | Temp 97.7°F | Resp 18 | Ht 73.0 in | Wt 250.0 lb

## 2022-02-25 DIAGNOSIS — I10 Essential (primary) hypertension: Secondary | ICD-10-CM

## 2022-02-25 DIAGNOSIS — I480 Paroxysmal atrial fibrillation: Secondary | ICD-10-CM | POA: Insufficient documentation

## 2022-02-25 DIAGNOSIS — Z01810 Encounter for preprocedural cardiovascular examination: Secondary | ICD-10-CM | POA: Insufficient documentation

## 2022-02-25 DIAGNOSIS — Z01812 Encounter for preprocedural laboratory examination: Secondary | ICD-10-CM | POA: Diagnosis present

## 2022-02-25 HISTORY — DX: Cardiac arrhythmia, unspecified: I49.9

## 2022-02-27 ENCOUNTER — Ambulatory Visit (HOSPITAL_BASED_OUTPATIENT_CLINIC_OR_DEPARTMENT_OTHER): Payer: No Typology Code available for payment source | Admitting: Anesthesiology

## 2022-02-27 ENCOUNTER — Encounter (HOSPITAL_COMMUNITY): Payer: Self-pay

## 2022-02-27 ENCOUNTER — Encounter (HOSPITAL_COMMUNITY)
Admission: RE | Disposition: A | Discharge status: Home / Self Care | Payer: Self-pay | Source: Home / Self Care | Attending: Internal Medicine

## 2022-02-27 ENCOUNTER — Ambulatory Visit (HOSPITAL_COMMUNITY): Payer: No Typology Code available for payment source | Admitting: Anesthesiology

## 2022-02-27 ENCOUNTER — Ambulatory Visit (HOSPITAL_COMMUNITY)
Admission: RE | Admit: 2022-02-27 | Discharge: 2022-02-27 | Disposition: A | Discharge status: Home / Self Care | Payer: No Typology Code available for payment source | Attending: Internal Medicine | Admitting: Internal Medicine

## 2022-02-27 DIAGNOSIS — D124 Benign neoplasm of descending colon: Secondary | ICD-10-CM | POA: Insufficient documentation

## 2022-02-27 DIAGNOSIS — D122 Benign neoplasm of ascending colon: Secondary | ICD-10-CM

## 2022-02-27 DIAGNOSIS — K648 Other hemorrhoids: Secondary | ICD-10-CM

## 2022-02-27 DIAGNOSIS — K635 Polyp of colon: Secondary | ICD-10-CM

## 2022-02-27 DIAGNOSIS — I10 Essential (primary) hypertension: Secondary | ICD-10-CM | POA: Diagnosis not present

## 2022-02-27 DIAGNOSIS — Z1211 Encounter for screening for malignant neoplasm of colon: Secondary | ICD-10-CM

## 2022-02-27 DIAGNOSIS — I4891 Unspecified atrial fibrillation: Secondary | ICD-10-CM | POA: Insufficient documentation

## 2022-02-27 DIAGNOSIS — K219 Gastro-esophageal reflux disease without esophagitis: Secondary | ICD-10-CM | POA: Diagnosis not present

## 2022-02-27 HISTORY — PX: POLYPECTOMY: SHX5525

## 2022-02-27 HISTORY — PX: COLONOSCOPY WITH PROPOFOL: SHX5780

## 2022-02-27 SURGERY — COLONOSCOPY WITH PROPOFOL
Anesthesia: General

## 2022-02-27 MED ORDER — PROPOFOL 500 MG/50ML IV EMUL
INTRAVENOUS | Status: AC
  Filled 2022-02-27: qty 50

## 2022-02-27 MED ORDER — LACTATED RINGERS IV SOLN: INTRAVENOUS | Status: DC

## 2022-02-27 MED ORDER — PROPOFOL 500 MG/50ML IV EMUL
INTRAVENOUS | Status: DC | PRN
  Administered 2022-02-27: 200 ug/kg/min via INTRAVENOUS

## 2022-02-27 MED ORDER — PROPOFOL 10 MG/ML IV BOLUS
INTRAVENOUS | Status: DC | PRN
  Administered 2022-02-27: 100 mg via INTRAVENOUS

## 2022-02-27 NOTE — Anesthesia Postprocedure Evaluation (Signed)
Anesthesia Post Note  Patient: Raymond Hansen  Procedure(s) Performed: COLONOSCOPY WITH PROPOFOL POLYPECTOMY  Patient location during evaluation: Phase II Anesthesia Type: General Level of consciousness: awake and alert and oriented Pain management: pain level controlled Vital Signs Assessment: post-procedure vital signs reviewed and stable Respiratory status: spontaneous breathing, nonlabored ventilation and respiratory function stable Cardiovascular status: blood pressure returned to baseline and stable Postop Assessment: no apparent nausea or vomiting Anesthetic complications: no   No notable events documented.   Last Vitals:  Vitals:   02/27/22 0706 02/27/22 0754  BP: (!) 136/94 116/80  Pulse: 67 73  Resp: 18 13  Temp: 37.1 C 37.1 C  SpO2: 98% 95%    Last Pain:  Vitals:   02/27/22 0754  TempSrc: Oral  PainSc: 0-No pain                 Jalyn Rosero C Zanai Mallari

## 2022-02-27 NOTE — Discharge Instructions (Signed)
  Colonoscopy Discharge Instructions  Read the instructions outlined below and refer to this sheet in the next few weeks. These discharge instructions provide you with general information on caring for yourself after you leave the hospital. Your doctor may also give you specific instructions. While your treatment has been planned according to the most current medical practices available, unavoidable complications occasionally occur.   ACTIVITY You may resume your regular activity, but move at a slower pace for the next 24 hours.  Take frequent rest periods for the next 24 hours.  Walking will help get rid of the air and reduce the bloated feeling in your belly (abdomen).  No driving for 24 hours (because of the medicine (anesthesia) used during the test).   Do not sign any important legal documents or operate any machinery for 24 hours (because of the anesthesia used during the test).  NUTRITION Drink plenty of fluids.  You may resume your normal diet as instructed by your doctor.  Begin with a light meal and progress to your normal diet. Heavy or fried foods are harder to digest and may make you feel sick to your stomach (nauseated).  Avoid alcoholic beverages for 24 hours or as instructed.  MEDICATIONS You may resume your normal medications unless your doctor tells you otherwise.  WHAT YOU CAN EXPECT TODAY Some feelings of bloating in the abdomen.  Passage of more gas than usual.  Spotting of blood in your stool or on the toilet paper.  IF YOU HAD POLYPS REMOVED DURING THE COLONOSCOPY: No aspirin products for 7 days or as instructed.  No alcohol for 7 days or as instructed.  Eat a soft diet for the next 24 hours.  FINDING OUT THE RESULTS OF YOUR TEST Not all test results are available during your visit. If your test results are not back during the visit, make an appointment with your caregiver to find out the results. Do not assume everything is normal if you have not heard from your  caregiver or the medical facility. It is important for you to follow up on all of your test results.  SEEK IMMEDIATE MEDICAL ATTENTION IF: You have more than a spotting of blood in your stool.  Your belly is swollen (abdominal distention).  You are nauseated or vomiting.  You have a temperature over 101.  You have abdominal pain or discomfort that is severe or gets worse throughout the day.   Your colonoscopy revealed 5 polyp(s) which I removed successfully. Await pathology results, my office will contact you. I recommend repeating colonoscopy in 5 years for surveillance purposes. Otherwise follow up with GI as needed.    I hope you have a great rest of your week!  Elon Alas. Abbey Chatters, D.O. Gastroenterology and Hepatology [facility]

## 2022-02-27 NOTE — H&P (Signed)
Primary Care Physician:  Perfecto Kingdom, NP Primary Gastroenterologist:  Dr. Abbey Chatters  Pre-Procedure History & Physical: HPI:  Raymond Hansen is a 51 y.o. male is here for first ever colonoscopy for colon cancer screening purposes.  Patient denies any family history of colorectal cancer.  No melena or hematochezia.  No abdominal pain or unintentional weight loss.  No change in bowel habits.  Overall feels well from a GI standpoint.  Past Medical History:  Diagnosis Date   Dysrhythmia    hx of A Fib   GERD (gastroesophageal reflux disease)    Melanoma ([place]) 07/14/2013   3 lesions removed- [facility]    Past Surgical History:  Procedure Laterality Date   HERNIA REPAIR  1974   SHOULDER SURGERY     SHOULDER SURGERY Right 2004    Prior to Admission medications   Medication Sig Start Date End Date Taking? Authorizing Provider  Ascorbic Acid (VITAMIN C) 500 MG CHEW Chew 500 mg by mouth daily.   Yes [provider]  cholecalciferol (VITAMIN D3) 25 MCG (1000 UNIT) tablet Take 1,000 Units by mouth daily.   Yes [provider]  fenofibrate 160 MG tablet Take 160 mg by mouth daily.   Yes [provider]  ibuprofen (ADVIL) 200 MG tablet Take 200 mg by mouth every 8 (eight) hours as needed for moderate pain.   Yes [provider]  lisinopril (ZESTRIL) 20 MG tablet Take 20 mg by mouth daily.   Yes [provider]  Menthol, Topical Analgesic, (BIOFREEZE) 4 % GEL Apply 1 Application topically as needed (pain).   Yes [provider]  metoprolol tartrate (LOPRESSOR) 25 MG tablet Take 25 mg by mouth daily as needed (AFIB).   Yes [provider]  omeprazole (PRILOSEC) 40 MG capsule TAKE 1 CAPSULE BY MOUTH  DAILY 07/09/20  Yes [place], Modena Nunnery, MD  Sod Picosulfate-Mag Ox-Cit Acd (CLENPIQ) 10-3.5-12 MG-GM -GM/175ML SOLN Take 1 kit by mouth as directed. 02/06/22  Yes Eloise Harman, DO    Allergies as of 02/06/2022 - Review  Complete 07/13/2019  Allergen Reaction Noted   Codeine  02/12/2011   Tricor [fenofibrate]  02/02/2015    Family History  Problem Relation Age of Onset   Multiple sclerosis Mother    COPD Mother    Diabetes Father    Cancer Maternal Grandfather        lung/liver   Heart disease Paternal Grandfather     Social History   Socioeconomic History   Marital status: Married    Spouse name: Not on file   Number of children: Not on file   Years of education: Not on file   Highest education level: Not on file  Occupational History   Not on file  Tobacco Use   Smoking status: Never   Smokeless tobacco: Former    Quit date: 04/23/2011  Substance and Sexual Activity   Alcohol use: Yes    Alcohol/week: 24.0 standard drinks of alcohol    Types: 24 Cans of beer per week   Drug use: No   Sexual activity: Yes  Other Topics Concern   Not on file  Social History Narrative   Not on file   Social Determinants of Health   Financial Resource Strain: Not on file  Food Insecurity: Not on file  Transportation Needs: Not on file  Physical Activity: Not on file  Stress: Not on file  Social Connections: Not on file  Intimate Partner Violence: Not on file  Review of Systems: See HPI, otherwise negative ROS  Physical Exam: Vital signs in last 24 hours: Temp:  [98.8 F (37.1 C)] 98.8 F (37.1 C) (08/17 0706) Pulse Rate:  [67] 67 (08/17 0706) Resp:  [18] 18 (08/17 0706) BP: (136)/(94) 136/94 (08/17 0706) SpO2:  [98 %] 98 % (08/17 0706)   General:   Alert,  Well-developed, well-nourished, pleasant and cooperative in NAD Head:  Normocephalic and atraumatic. Eyes:  Sclera clear, no icterus.   Conjunctiva pink. Ears:  Normal auditory acuity. Nose:  No deformity, discharge,  or lesions. Mouth:  No deformity or lesions, dentition normal. Neck:  Supple; no masses or thyromegaly. Lungs:  Clear throughout to auscultation.   No wheezes, crackles, or rhonchi. No acute distress. Heart:   Regular rate and rhythm; no murmurs, clicks, rubs,  or gallops. Abdomen:  Soft, nontender and nondistended. No masses, hepatosplenomegaly or hernias noted. Normal bowel sounds, without guarding, and without rebound.   Msk:  Symmetrical without gross deformities. Normal posture. Extremities:  Without clubbing or edema. Neurologic:  Alert and  oriented x4;  grossly normal neurologically. Skin:  Intact without significant lesions or rashes. Cervical Nodes:  No significant cervical adenopathy. Psych:  Alert and cooperative. Normal mood and affect.  Impression/Plan: [name] is here for a colonoscopy to be performed for colon cancer screening purposes.  The risks of the procedure including infection, bleed, or perforation as well as benefits, limitations, alternatives and imponderables have been reviewed with the patient. Questions have been answered. All parties agreeable.

## 2022-02-27 NOTE — Anesthesia Preprocedure Evaluation (Signed)
Anesthesia Evaluation  Patient identified by MRN, date of birth, ID band Patient awake    Reviewed: Allergy & Precautions, NPO status , Patient's Chart, lab work & pertinent test results, reviewed documented beta blocker date and time   Airway Mallampati: III  TM Distance: >3 FB Neck ROM: Full    Dental  (+) Dental Advisory Given, Teeth Intact   Pulmonary neg pulmonary ROS,    Pulmonary exam normal breath sounds clear to auscultation       Cardiovascular Exercise Tolerance: Good hypertension, Pt. on medications Normal cardiovascular exam+ dysrhythmias Atrial Fibrillation  Rhythm:Regular Rate:Normal     Neuro/Psych negative neurological ROS  negative psych ROS   GI/Hepatic GERD  Medicated and Controlled,(+)     substance abuse  alcohol use,   Endo/Other  Hypothyroidism   Renal/GU negative Renal ROS  negative genitourinary   Musculoskeletal negative musculoskeletal ROS (+)   Abdominal   Peds negative pediatric ROS (+)  Hematology negative hematology ROS (+)   Anesthesia Other Findings   Reproductive/Obstetrics negative OB ROS                             Anesthesia Physical Anesthesia Plan  ASA: 2  Anesthesia Plan: General   Post-op Pain Management: Minimal or no pain anticipated   Induction: Intravenous  PONV Risk Score and Plan: Propofol infusion  Airway Management Planned: Nasal Cannula and Natural Airway  Additional Equipment:   Intra-op Plan:   Post-operative Plan:   Informed Consent: I have reviewed the patients History and Physical, chart, labs and discussed the procedure including the risks, benefits and alternatives for the proposed anesthesia with the patient or authorized representative who has indicated his/her understanding and acceptance.     Dental advisory given  Plan Discussed with: CRNA and Surgeon  Anesthesia Plan Comments:         Anesthesia  Quick Evaluation

## 2022-02-27 NOTE — Transfer of Care (Signed)
Immediate Anesthesia Transfer of Care Note  Patient: Raymond Hansen  Procedure(s) Performed: COLONOSCOPY WITH PROPOFOL POLYPECTOMY  Patient Location: PACU  Anesthesia Type:General  Level of Consciousness: awake, alert  and oriented  Airway & Oxygen Therapy: Patient Spontanous Breathing  Post-op Assessment: Report given to RN, Post -op Vital signs reviewed and stable, Patient moving all extremities X 4 and Patient able to stick tongue midline  Post vital signs: Reviewed  Last Vitals:  Vitals Value Taken Time  BP 116/80 02/27/22 0754  Temp 37.1 C 02/27/22 0754  Pulse 73 02/27/22 0754  Resp 13 02/27/22 0754  SpO2 95 % 02/27/22 0754    Last Pain:  Vitals:   02/27/22 0754  TempSrc: Oral  PainSc: 0-No pain      Patients Stated Pain Goal: 5 (57/32/25 6720)  Complications: No notable events documented.

## 2022-02-27 NOTE — Op Note (Signed)
[facility] Patient Name: Raymond Hansen Procedure Date: 02/27/2022 7:14 AM MRN: [id number] Date of Birth: January 25, 1971 Attending MD: Elon Alas. Abbey Chatters DO CSN: [id number] Age: 51 Admit Type: Outpatient Procedure:                Colonoscopy Indications:              Screening for colorectal malignant neoplasm Providers:                Elon Alas. Abbey Chatters, DO, Charlsie Quest. Insurance claims handler, RN,                            Rosina Lowenstein, RN, Ladoris Gene Technician, Technician Referring MD:             Elon Alas. Abbey Chatters, DO Medicines:                See the Anesthesia note for documentation of the                            administered medications Complications:            No immediate complications. Estimated Blood Loss:     Estimated blood loss was minimal. Procedure:                Pre-Anesthesia Assessment:                           - The anesthesia plan was to use monitored                            anesthesia care (MAC).                           After obtaining informed consent, the colonoscope                            was passed under direct vision. Throughout the                            procedure, the patient's blood pressure, pulse, and                            oxygen saturations were monitored continuously. The                            [id number]-[id number] ([id number]) scope was introduced through                            the anus and advanced to the the cecum, identified                            by appendiceal orifice and ileocecal valve. The                            colonoscopy was performed without difficulty. The  patient tolerated the procedure well. The quality                            of the bowel preparation was evaluated using the                            BBPS [facility] Bowel Preparation Scale) with scores                            of: Right Colon = 3, Transverse Colon = 3 and Left                            Colon = 3 (entire mucosa seen well  with no residual                            staining, small fragments of stool or opaque                            liquid). The total BBPS score equals 9. Scope In: 7:38:05 AM Scope Out: 7:49:29 AM Scope Withdrawal Time: 0 hours 9 minutes 48 seconds  Total Procedure Duration: 0 hours 11 minutes 24 seconds  Findings:      The perianal and digital rectal examinations were normal.      Non-bleeding internal hemorrhoids were found during endoscopy.      Five sessile polyps were found in the descending colon and transverse       colon. The polyps were 4 to 6 mm in size. These polyps were removed with       a cold snare. Resection and retrieval were complete.      The exam was otherwise without abnormality. Impression:               - Non-bleeding internal hemorrhoids.                           - Five 4 to 6 mm polyps in the descending colon and                            in the transverse colon, removed with a cold snare.                            Resected and retrieved.                           - The examination was otherwise normal. Moderate Sedation:      Per Anesthesia Care Recommendation:           - Patient has a contact number available for                            emergencies. The signs and symptoms of potential                            delayed complications were discussed with the  patient. Return to normal activities tomorrow.                            Written discharge instructions were provided to the                            patient.                           - Resume previous diet.                           - Continue present medications.                           - Await pathology results.                           - Repeat colonoscopy in 5 years for surveillance.                           - Return to GI clinic PRN. Procedure Code(s):        --- Professional ---                           [phone], Colonoscopy, flexible; with removal of                             tumor(s), polyp(s), or other lesion(s) by snare                            technique Diagnosis Code(s):        --- Professional ---                           Z12.11, Encounter for screening for malignant                            neoplasm of colon                           K63.5, Polyp of colon                           K64.8, Other hemorrhoids CPT copyright 2019 American Medical Association. All rights reserved. The codes documented in this report are preliminary and upon coder review may  be revised to meet current compliance requirements. Elon Alas. Abbey Chatters, DO [place] Abbey Chatters, DO 02/27/2022 7:51:52 AM This report has been signed electronically. Number of Addenda: 0

## 2022-02-28 LAB — SURGICAL PATHOLOGY

## 2022-03-04 ENCOUNTER — Encounter (HOSPITAL_COMMUNITY): Payer: Self-pay | Admitting: Internal Medicine

## 2022-11-14 ENCOUNTER — Emergency Department (HOSPITAL_COMMUNITY): Payer: No Typology Code available for payment source

## 2022-11-14 ENCOUNTER — Encounter (HOSPITAL_COMMUNITY): Payer: Self-pay | Admitting: Emergency Medicine

## 2022-11-14 ENCOUNTER — Emergency Department (HOSPITAL_COMMUNITY)
Admission: EM | Admit: 2022-11-14 | Discharge: 2022-11-14 | Disposition: A | Discharge status: Home / Self Care | Payer: No Typology Code available for payment source | Attending: Student | Admitting: Student

## 2022-11-14 DIAGNOSIS — I480 Paroxysmal atrial fibrillation: Secondary | ICD-10-CM | POA: Insufficient documentation

## 2022-11-14 DIAGNOSIS — R0602 Shortness of breath: Secondary | ICD-10-CM | POA: Insufficient documentation

## 2022-11-14 DIAGNOSIS — E039 Hypothyroidism, unspecified: Secondary | ICD-10-CM | POA: Diagnosis not present

## 2022-11-14 DIAGNOSIS — Z87891 Personal history of nicotine dependence: Secondary | ICD-10-CM | POA: Diagnosis not present

## 2022-11-14 DIAGNOSIS — Z85820 Personal history of malignant melanoma of skin: Secondary | ICD-10-CM | POA: Diagnosis not present

## 2022-11-14 DIAGNOSIS — Z79899 Other long term (current) drug therapy: Secondary | ICD-10-CM | POA: Diagnosis not present

## 2022-11-14 DIAGNOSIS — R002 Palpitations: Secondary | ICD-10-CM | POA: Diagnosis present

## 2022-11-14 DIAGNOSIS — Z7901 Long term (current) use of anticoagulants: Secondary | ICD-10-CM | POA: Diagnosis not present

## 2022-11-14 DIAGNOSIS — I10 Essential (primary) hypertension: Secondary | ICD-10-CM | POA: Insufficient documentation

## 2022-11-14 LAB — CBC WITH DIFFERENTIAL/PLATELET
Abs Immature Granulocytes: 0.03 10*3/uL (ref 0.00–0.07)
Basophils Absolute: 0.1 10*3/uL (ref 0.0–0.1)
Basophils Relative: 1 %
Eosinophils Absolute: 0.1 10*3/uL (ref 0.0–0.5)
Eosinophils Relative: 2 %
HCT: 48 % (ref 39.0–52.0)
Hemoglobin: 17.1 g/dL — ABNORMAL HIGH (ref 13.0–17.0)
Immature Granulocytes: 0 %
Lymphocytes Relative: 25 %
Lymphs Abs: 2 10*3/uL (ref 0.7–4.0)
MCH: 32.9 pg (ref 26.0–34.0)
MCHC: 35.6 g/dL (ref 30.0–36.0)
MCV: 92.3 fL (ref 80.0–100.0)
Monocytes Absolute: 0.8 10*3/uL (ref 0.1–1.0)
Monocytes Relative: 10 %
Neutro Abs: 5.1 10*3/uL (ref 1.7–7.7)
Neutrophils Relative %: 62 %
Platelets: 237 10*3/uL (ref 150–400)
RBC: 5.2 MIL/uL (ref 4.22–5.81)
RDW: 12.5 % (ref 11.5–15.5)
WBC: 8 10*3/uL (ref 4.0–10.5)
nRBC: 0 % (ref 0.0–0.2)

## 2022-11-14 LAB — COMPREHENSIVE METABOLIC PANEL
ALT: 52 U/L — ABNORMAL HIGH (ref 0–44)
AST: 44 U/L — ABNORMAL HIGH (ref 15–41)
Albumin: 4.3 g/dL (ref 3.5–5.0)
Alkaline Phosphatase: 42 U/L (ref 38–126)
Anion gap: 10 (ref 5–15)
BUN: 10 mg/dL (ref 6–20)
CO2: 26 mmol/L (ref 22–32)
Calcium: 9.5 mg/dL (ref 8.9–10.3)
Chloride: 101 mmol/L (ref 98–111)
Creatinine, Ser: 1.05 mg/dL (ref 0.61–1.24)
GFR, Estimated: >60 mL/min (ref >60)
Glucose, Bld: 108 mg/dL — ABNORMAL HIGH (ref 70–99)
Potassium: 3.5 mmol/L (ref 3.5–5.1)
Sodium: 137 mmol/L (ref 135–145)
Total Bilirubin: 0.9 mg/dL (ref 0.3–1.2)
Total Protein: 7.4 g/dL (ref 6.5–8.1)

## 2022-11-14 LAB — TROPONIN I (HIGH SENSITIVITY): Troponin I (High Sensitivity): 5 ng/L (ref <18)

## 2022-11-14 LAB — BRAIN NATRIURETIC PEPTIDE: B Natriuretic Peptide: 305 pg/mL — ABNORMAL HIGH (ref 0.0–100.0)

## 2022-11-14 MED ORDER — METOPROLOL TARTRATE 25 MG PO TABS: 25.0000 mg | ORAL_TABLET | Freq: Every day | ORAL | 1 refills | Status: DC | PRN

## 2022-11-14 MED ORDER — METOPROLOL TARTRATE 25 MG PO TABS: 25.0000 mg | ORAL_TABLET | Freq: Every day | ORAL | 1 refills | Status: AC | PRN

## 2022-11-14 NOTE — ED Triage Notes (Signed)
Pt here from home with with c/o sob and dizziness , has history of afib and fells like is back in afib took his lopressor last night and this morning , has not seen cards in a bout 5 years

## 2022-11-14 NOTE — ED Provider Notes (Signed)
Raymond Hansen EMERGENCY DEPARTMENT AT [facility] Provider Note  CSN: [id number] Arrival date & time: 11/14/22 1309  Chief Complaint(s) Shortness of Breath and Atrial Fibrillation  HPI Raymond Hansen is a 52 y.o. male with PMH paroxysmal A-fib, GERD, melanoma, subclinical hypothyroidism who presents emergency department for evaluation of shortness of breath and palpitations.  Patient states that he has had 2 episodes of paroxysmal A-fib before and is currently not on anticoagulation based on low CHA2DS2-VASc score.  He was previously following with the [facility] clinic in [place] but has not seen them for multiple years and states that he was told that he has been dismissed from the practice not been seen in regular follow-up.  He states that he felt himself go into atrial fibrillation last evening and his current instructions are to use as needed oral Lopressor if A-fib is to return.  Patient took a total of 75 mg of Lopressor last night and took an additional 25 mg tablet this morning.  Patient does report an improvement in symptoms and comes emergency room for further evaluation.  Patient does arrive in A-fib with RVR with rates in the 110s.   Past Medical History Past Medical History:  Diagnosis Date   Dysrhythmia    hx of A Fib   GERD (gastroesophageal reflux disease)    Melanoma ([facility]) 07/14/2013   3 lesions removed- [facility]   Patient Active Problem List   Diagnosis Date Noted   PAF (paroxysmal atrial fibrillation) (HCC) 07/13/2019   Subclinical hypothyroidism 07/26/2018   Fatty liver disease, nonalcoholic 07/26/2018   Hypertriglyceridemia 02/21/2014   Routine general medical examination at a health care facility 10/31/2013   Left shoulder pain 04/22/2013   GERD (gastroesophageal reflux disease) 04/22/2013   Essential hypertension, benign 04/22/2013   Obesity 04/22/2013   Home Medication(s) Prior to Admission medications   Medication Sig Start Date End  Date Taking? Authorizing Provider  Ascorbic Acid (VITAMIN C) 500 MG CHEW Chew 500 mg by mouth daily.    [provider]  cholecalciferol (VITAMIN D3) 25 MCG (1000 UNIT) tablet Take 1,000 Units by mouth daily.    [provider]  fenofibrate 160 MG tablet Take 160 mg by mouth daily.    [provider]  ibuprofen (ADVIL) 200 MG tablet Take 200 mg by mouth every 8 (eight) hours as needed for moderate pain.    [provider]  lisinopril (ZESTRIL) 20 MG tablet Take 20 mg by mouth daily.    [provider]  Menthol, Topical Analgesic, (BIOFREEZE) 4 % GEL Apply 1 Application topically as needed (pain).    [provider]  metoprolol tartrate (LOPRESSOR) 25 MG tablet Take 1 tablet (25 mg total) by mouth daily as needed (AFIB). 11/14/22   Maybell Misenheimer, Wyn Forster, MD  omeprazole (PRILOSEC) 40 MG capsule TAKE 1 CAPSULE BY MOUTH  DAILY 07/09/20   Salley Scarlet, MD  Past Surgical History Past Surgical History:  Procedure Laterality Date   COLONOSCOPY WITH PROPOFOL N/A 02/27/2022   Procedure: COLONOSCOPY WITH PROPOFOL;  Surgeon: Lanelle Bal, DO;  Location: [facility];  Service: Endoscopy;  Laterality: N/A;  7:30am, asa 3   HERNIA REPAIR  1974   POLYPECTOMY  02/27/2022   Procedure: POLYPECTOMY;  Surgeon: Lanelle Bal, DO;  Location: [facility] ENDO SUITE;  Service: Endoscopy;;   SHOULDER SURGERY     SHOULDER SURGERY Right 2004   Family History Family History  Problem Relation Age of Onset   Multiple sclerosis Mother    COPD Mother    Diabetes Father    Cancer Maternal Grandfather        lung/liver   Heart disease Paternal Grandfather     Social History Social History   Tobacco Use   Smoking status: Never   Smokeless tobacco: Former    Quit date: 04/23/2011  Substance Use Topics   Alcohol use: Yes     Alcohol/week: 24.0 standard drinks of alcohol    Types: 24 Cans of beer per week   Drug use: No   Allergies Codeine  Review of Systems Review of Systems  Respiratory:  Positive for shortness of breath.   Cardiovascular:  Positive for palpitations.    Physical Exam Vital Signs  I have reviewed the triage vital signs BP 107/88   Pulse 72   Temp 98.7 F (37.1 C) (Oral)   Resp 16   Ht 6\' 1"  (1.854 m)   Wt 115.7 kg   SpO2 97%   BMI 33.64 kg/m   Physical Exam Vitals and nursing note reviewed.  Constitutional:      General: He is not in acute distress.    Appearance: He is well-developed.  HENT:     Head: Normocephalic and atraumatic.  Eyes:     Conjunctiva/sclera: Conjunctivae normal.  Cardiovascular:     Rate and Rhythm: Tachycardia present. Rhythm irregular.     Heart sounds: No murmur heard. Pulmonary:     Effort: Pulmonary effort is normal. No respiratory distress.  Musculoskeletal:        General: No swelling.     Cervical back: Neck supple.  Skin:    General: Skin is warm and dry.  Neurological:     Mental Status: He is alert.  Psychiatric:        Mood and Affect: Mood normal.     ED Results and Treatments Labs (all labs ordered are listed, but only abnormal results are displayed) Labs Reviewed  COMPREHENSIVE METABOLIC PANEL - Abnormal; Notable for the following components:      Result Value   Glucose, Bld 108 (*)    AST 44 (*)    ALT 52 (*)    All other components within normal limits  CBC WITH DIFFERENTIAL/PLATELET - Abnormal; Notable for the following components:   Hemoglobin 17.1 (*)    All other components within normal limits  BRAIN NATRIURETIC PEPTIDE - Abnormal; Notable for the following components:   B Natriuretic Peptide 305.0 (*)    All other components within normal limits  TROPONIN I (HIGH SENSITIVITY)  Radiology DG Chest 2 View  Result Date: 11/14/2022 CLINICAL DATA:  Shortness of breath EXAM: CHEST - 2 VIEW COMPARISON:  None Available. FINDINGS: No pleural effusion. Pneumothorax. No focal airspace opacity. Normal cardiac and mediastinal contours. Radiographically apparent displaced fractures. Visualized upper abdomen is unremarkable. Vertebral body heights are maintained. IMPRESSION: No focal airspace opacity. Electronically Signed   By: Lorenza Cambridge M.D.   On: 11/14/2022 14:01    Pertinent labs & imaging results that were available during my care of the patient were reviewed by me and considered in my medical decision making (see MDM for details).  Medications Ordered in ED Medications - No data to display                                                                                                                                   Procedures Procedures  (including critical care time)  Medical Decision Making / ED Course   This patient presents to the ED for concern of shortness of breath, palpitations, this involves an extensive number of treatment options, and is a complaint that carries with it a high risk of complications and morbidity.  The differential diagnosis includes electrolyte abnormality, paroxysmal A-fib, dehydration, failure of outpatient regimen, heart failure, pleural effusion  MDM: Patient seen emergency room for evaluation of palpitations and shortness of breath.  Physical exam with a irregular tachycardia but is otherwise unremarkable.  Very mild lower extremity edema seen.  Laboratory evaluation largely unremarkable outside of a BNP elevation to 305.  Chest x-ray without overt pulmonary edema.  On return from x-ray, patient has self converted back to normal sinus rhythm with a rate of 72 beats a minute.  He is requesting to be linked in with the cardiology team here in [place] and I did speak with the cardiologist on-call Dr. Jerre Simon who states that the  patient is fine to continue his metoprolol as needed regimen, no major changes to his plan today.  Now the patient has returned to normal sinus rhythm, he states his symptoms have resolved.  A referral was placed to cardiology and patient discharged with outpatient follow-up   Additional history obtained: -Additional history obtained from wife -External records from outside source obtained and reviewed including: Chart review including previous notes, labs, imaging, consultation notes   Lab Tests: -I ordered, reviewed, and interpreted labs.   The pertinent results include:   Labs Reviewed  COMPREHENSIVE METABOLIC PANEL - Abnormal; Notable for the following components:      Result Value   Glucose, Bld 108 (*)    AST 44 (*)    ALT 52 (*)    All other components within normal limits  CBC WITH DIFFERENTIAL/PLATELET - Abnormal; Notable for the following components:   Hemoglobin 17.1 (*)    All other components within normal limits  BRAIN NATRIURETIC PEPTIDE - Abnormal; Notable for the following components:   B Natriuretic Peptide 305.0 (*)  All other components within normal limits  TROPONIN I (HIGH SENSITIVITY)      EKG   EKG Interpretation  Date/Time:  Friday Nov 14 2022 13:43:17 EDT Ventricular Rate:  75 PR Interval:  173 QRS Duration: 101 QT Interval:  386 QTC Calculation: 432 R Axis:   25 Text Interpretation: Sinus rhythm Probable anteroseptal infarct, old Confirmed by Mirjana Tarleton ([id number]) on 11/14/2022 2:11:52 PM         Imaging Studies ordered: I ordered imaging studies including chest x-ray I independently visualized and interpreted imaging. I agree with the radiologist interpretation   Medicines ordered and prescription drug management: Meds ordered this encounter  Medications   DISCONTD: metoprolol tartrate (LOPRESSOR) 25 MG tablet    Sig: Take 1 tablet (25 mg total) by mouth daily as needed (AFIB).    Dispense:  30 tablet    Refill:  1   metoprolol  tartrate (LOPRESSOR) 25 MG tablet    Sig: Take 1 tablet (25 mg total) by mouth daily as needed (AFIB).    Dispense:  30 tablet    Refill:  1    -I have reviewed the patients home medicines and have made adjustments as needed  Critical interventions none  Consultations Obtained: I requested consultation with the cardiologist on-call Dr. Jerre Simon,  and discussed lab and imaging findings as well as pertinent plan - they recommend: No changes to current medicine regimen   Cardiac Monitoring: The patient was maintained on a cardiac monitor.  I personally viewed and interpreted the cardiac monitored which showed an underlying rhythm of: A-fib with RVR, NSR  Social Determinants of Health:  Factors impacting patients care include: In need of new cardiology team   Reevaluation: After the interventions noted above, I reevaluated the patient and found that they have :improved  Co morbidities that complicate the patient evaluation  Past Medical History:  Diagnosis Date   Dysrhythmia    hx of A Fib   GERD (gastroesophageal reflux disease)    Melanoma (HCC) 07/14/2013   3 lesions removed-  Dermatology      Dispostion: I considered admission for this patient, but with return to normal sinus rhythm and resolution of symptoms, patient safe for discharge with outpatient follow-up     Final Clinical Impression(s) / ED Diagnoses Final diagnoses:  Paroxysmal atrial fibrillation [facility])     @PCDICTATION @    Glendora Score, MD 11/14/22 1814

## 2022-11-20 ENCOUNTER — Encounter: Payer: Self-pay | Admitting: Cardiology

## 2022-11-20 ENCOUNTER — Ambulatory Visit: Payer: No Typology Code available for payment source | Attending: Cardiology | Admitting: Cardiology

## 2022-11-20 VITALS — BP 128/84 | HR 80 | Ht 73.0 in | Wt 259.0 lb

## 2022-11-20 DIAGNOSIS — I480 Paroxysmal atrial fibrillation: Secondary | ICD-10-CM | POA: Diagnosis not present

## 2022-11-20 DIAGNOSIS — I519 Heart disease, unspecified: Secondary | ICD-10-CM | POA: Diagnosis not present

## 2022-11-20 NOTE — Progress Notes (Signed)
Clinical Summary Raymond Hansen is a 52 y.o.male seen today as a new patient for the following medical problems.   1.PAF - consult note 12/2017 [facility] reports ER visit with rapid afib - converted back to SR with cardizem at that time - notes mention heavy EtOH use at the tmie.  - low chads2vasc score, not started on anticoag  11/14/22 ER [facility]  with afib and palpitations, SOB - self converted in ER.   - typiclaly mild infrequent symptoms  CHADS2Vasc 1 (HTN), if ongoind systolic dysfunction would qualify as a 2.    2. LV systolic dysfunction - 2019 echo at [facility] LVEF 40-45% - denies any HF symptmos.   Past Medical History:  Diagnosis Date   Dysrhythmia    hx of A Fib   GERD (gastroesophageal reflux disease)    Melanoma (HCC) 07/14/2013   3 lesions removed- [facility]     Allergies  Allergen Reactions   Codeine     Since childhood     Current Outpatient Medications  Medication Sig Dispense Refill   Ascorbic Acid (VITAMIN C) 500 MG CHEW Chew 500 mg by mouth daily.     cholecalciferol (VITAMIN D3) 25 MCG (1000 UNIT) tablet Take 1,000 Units by mouth daily.     fenofibrate 160 MG tablet Take 160 mg by mouth daily.     ibuprofen (ADVIL) 200 MG tablet Take 200 mg by mouth every 8 (eight) hours as needed for moderate pain.     lisinopril (ZESTRIL) 20 MG tablet Take 20 mg by mouth daily.     Menthol, Topical Analgesic, (BIOFREEZE) 4 % GEL Apply 1 Application topically as needed (pain).     metoprolol tartrate (LOPRESSOR) 25 MG tablet Take 1 tablet (25 mg total) by mouth daily as needed (AFIB). 30 tablet 1   omeprazole (PRILOSEC) 40 MG capsule TAKE 1 CAPSULE BY MOUTH  DAILY 90 capsule 0   No current facility-administered medications for this visit.     Past Surgical History:  Procedure Laterality Date   COLONOSCOPY WITH PROPOFOL N/A 02/27/2022   Procedure: COLONOSCOPY WITH PROPOFOL;  Surgeon: Lanelle Bal, DO;  [facility];  Service:  Endoscopy;  Laterality: N/A;  7:30am, asa 3   HERNIA REPAIR  1974   POLYPECTOMY  02/27/2022   Procedure: POLYPECTOMY;  Surgeon: Lanelle Bal, DO;  Location: [facility];  Service: Endoscopy;;   SHOULDER SURGERY     SHOULDER SURGERY Right 2004     Allergies  Allergen Reactions   Codeine     Since childhood      Family History  Problem Relation Age of Onset   Multiple sclerosis Mother    COPD Mother    Diabetes Father    Cancer Maternal Grandfather        lung/liver   Heart disease Paternal Grandfather      Social History Raymond Hansen reports that he has never smoked. He quit smokeless tobacco use about 11 years ago. Raymond Hansen reports current alcohol use of about 24.0 standard drinks of alcohol per week.   Review of Systems CONSTITUTIONAL: No weight loss, fever, chills, weakness or fatigue.  HEENT: Eyes: No visual loss, blurred vision, double vision or yellow sclerae.No hearing loss, sneezing, congestion, runny nose or sore throat.  SKIN: No rash or itching.  CARDIOVASCULAR: per hpi RESPIRATORY: No shortness of breath, cough or sputum.  GASTROINTESTINAL: No anorexia, nausea, vomiting or diarrhea. No abdominal pain or blood.  GENITOURINARY:  No burning on urination, no polyuria NEUROLOGICAL: No headache, dizziness, syncope, paralysis, ataxia, numbness or tingling in the extremities. No change in bowel or bladder control.  MUSCULOSKELETAL: No muscle, back pain, joint pain or stiffness.  LYMPHATICS: No enlarged nodes. No history of splenectomy.  PSYCHIATRIC: No history of depression or anxiety.  ENDOCRINOLOGIC: No reports of sweating, cold or heat intolerance. No polyuria or polydipsia.  Marland Kitchen   Physical Examination Today's Vitals   11/20/22 1038  BP: 128/84  Pulse: 80  SpO2: 99%  Weight: 259 lb (117.5 kg)  Height: 6\' 1"  (1.854 m)   Body mass index is 34.17 kg/m.  Gen: resting comfortably, no acute distress HEENT: no scleral icterus, pupils equal round and  reactive, no palptable cervical adenopathy,  CV: RRR, no mrg, no jvd Resp: Clear to auscultation bilaterally GI: abdomen is soft, non-tender, non-distended, normal bowel sounds, no hepatosplenomegaly MSK: extremities are warm, no edema.  Skin: warm, no rash Neuro:  no focal deficits Psych: appropriate affect   Diagnostic Studies  2019 Echo [facility] INTERPRETATION  MILD LV SYSTOLIC DYSFUNCTION (See above)   WITH MILD LVH  NORMAL RIGHT VENTRICULAR SYSTOLIC FUNCTION  MILD VALVULAR REGURGITATION (See above)  NO VALVULAR STENOSIS  MILD MR, TR  EF 40-45%   12/2017 nuclear stress [facility] Normal overall myocardial perfusion scan no evidence of  stress-induced myocardial ischemia ejection fraction of 52% conclusion  negative scan      Assessment and Plan  1.PAF - overall infrequent episodes, will continue prn lopressor for now. If progression could change to daily dosing - CHADS2Vasc is 1 based on HTN, if ongong systolic dysfunction would qualify as 2 and would need to start anticoagulation. Will f/u echo  2. Systolic dysfunction - 2019 echo at [facility] LVEF 40-45% - no HF symptoms - repeat echo, if ongoing will need HF medication regimen initiated and titrated over time   F/u 6 months, earlier if LVEF is still low      Antoine Poche, M.D.

## 2022-11-20 NOTE — Patient Instructions (Signed)
Medication Instructions:  Your physician recommends that you continue on your current medications as directed. Please refer to the Current Medication list given to you today.  *If you need a refill on your cardiac medications before your next appointment, please call your pharmacy*   Lab Work: None If you have labs (blood work) drawn today and your tests are completely normal, you will receive your results only by: MyChart Message (if you have MyChart) OR A paper copy in the mail If you have any lab test that is abnormal or we need to change your treatment, we will call you to review the results.   Testing/Procedures: Your physician has requested that you have an echocardiogram. Echocardiography is a painless test that uses sound waves to create images of your heart. It provides your doctor with information about the size and shape of your heart and how well your heart's chambers and valves are working. This procedure takes approximately one hour. There are no restrictions for this procedure. Please do NOT wear cologne, perfume, aftershave, or lotions (deodorant is allowed). Please arrive 15 minutes prior to your appointment time.    Follow-Up: At [facility], you and your health needs are our priority.  As part of our continuing mission to provide you with exceptional heart care, we have created designated Provider Care Teams.  These Care Teams include your primary Cardiologist (physician) and Advanced Practice Providers (APPs -  Physician Assistants and Nurse Practitioners) who all work together to provide you with the care you need, when you need it.  We recommend signing up for the patient portal called "MyChart".  Sign up information is provided on this After Visit Summary.  MyChart is used to connect with patients for Virtual Visits (Telemedicine).  Patients are able to view lab/test results, encounter notes, upcoming appointments, etc.  Non-urgent messages can be sent to your  provider as well.   To learn more about what you can do with MyChart, go to [email or web address].    Your next appointment:   6 month(s)  Provider:   Jonathan Branch, MD    Other Instructions    

## 2022-11-26 ENCOUNTER — Ambulatory Visit
Admission: EM | Admit: 2022-11-26 | Discharge: 2022-11-26 | Disposition: A | Discharge status: Home / Self Care | Payer: No Typology Code available for payment source | Attending: Nurse Practitioner | Admitting: Nurse Practitioner

## 2022-11-26 DIAGNOSIS — B029 Zoster without complications: Secondary | ICD-10-CM

## 2022-11-26 MED ORDER — GABAPENTIN 100 MG PO CAPS: 100.0000 mg | ORAL_CAPSULE | Freq: Three times a day (TID) | ORAL | 0 refills | Status: AC

## 2022-11-26 MED ORDER — PREDNISONE 20 MG PO TABS
40.0000 mg | ORAL_TABLET | Freq: Every day | ORAL | 0 refills | Status: AC
Start: 2022-11-26 — End: 2022-12-01

## 2022-11-26 MED ORDER — VALACYCLOVIR HCL 1 G PO TABS: 1000.0000 mg | ORAL_TABLET | Freq: Three times a day (TID) | ORAL | 0 refills | Status: AC

## 2022-11-26 NOTE — ED Provider Notes (Signed)
[facility]    CSN: [id number] Arrival date & time: 11/26/22  0913      History   Chief Complaint No chief complaint on file.   HPI Raymond Hansen is a 52 y.o. male.   The history is provided by the patient.   Patient presents for complaints of a "burning sensation" in the right side of his head, right ear pain, rash to the right side of his mouth, right side of his neck.  Patient states symptoms started over the last several days.  He states the rash to the right side of his mouth is "itchy".  He also complains of headache in the right his head patient states approximately 2 weeks to his symptoms, he did have swelling in the lymph node on the right side of his neck.  Patient states that the lymph node is tender to palpation.  He denies fever, chills, ear drainage, nasal congestion, runny nose, chest pain, abdominal pain, nausea, vomiting, or diarrhea.  Patient reports that he did have chickenpox and shingles when he was younger.  He has taken Benadryl and Allegra for his symptoms.  Patient does report when he is under an increased amount of stress due to his job.   Past Medical History:  Diagnosis Date   Dysrhythmia    hx of A Fib   GERD (gastroesophageal reflux disease)    Melanoma (HCC) 07/14/2013   3 lesions removed- [facility]    Patient Active Problem List   Diagnosis Date Noted   PAF (paroxysmal atrial fibrillation) (HCC) 07/13/2019   Subclinical hypothyroidism 07/26/2018   Fatty liver disease, nonalcoholic 07/26/2018   Hypertriglyceridemia 02/21/2014   Routine general medical examination at a health care facility 10/31/2013   Left shoulder pain 04/22/2013   GERD (gastroesophageal reflux disease) 04/22/2013   Essential hypertension, benign 04/22/2013   Obesity 04/22/2013    Past Surgical History:  Procedure Laterality Date   COLONOSCOPY WITH PROPOFOL N/A 02/27/2022   Procedure: COLONOSCOPY WITH PROPOFOL;  Surgeon: Lanelle Bal, DO;   [facility];  Service: Endoscopy;  Laterality: N/A;  7:30am, asa 3   HERNIA REPAIR  1974   POLYPECTOMY  02/27/2022   Procedure: POLYPECTOMY;  Surgeon: Lanelle Bal, DO;  [facility];  Service: Endoscopy;;   SHOULDER SURGERY     SHOULDER SURGERY Right 2004       Home Medications    Prior to Admission medications   Medication Sig Start Date End Date Taking? Authorizing Provider  gabapentin (NEURONTIN) 100 MG capsule Take 1 capsule (100 mg total) by mouth 3 (three) times daily. 11/26/22  Yes Ilona Colley-Warren, Sadie Haber, NP  predniSONE (DELTASONE) 20 MG tablet Take 2 tablets (40 mg total) by mouth daily with breakfast for 5 days. 11/26/22 12/01/22 Yes Javan Gonzaga-Warren, Sadie Haber, NP  valACYclovir (VALTREX) 1000 MG tablet Take 1 tablet (1,000 mg total) by mouth 3 (three) times daily for 7 days. 11/26/22 12/03/22 Yes Emalee Knies-Warren, Sadie Haber, NP  Ascorbic Acid (VITAMIN C) 500 MG CHEW Chew 500 mg by mouth daily.    [provider]  cholecalciferol (VITAMIN D3) 25 MCG (1000 UNIT) tablet Take 1,000 Units by mouth daily.    [provider]  fenofibrate 160 MG tablet Take 160 mg by mouth daily.    [provider]  ibuprofen (ADVIL) 200 MG tablet Take 200 mg by mouth every 8 (eight) hours as needed for moderate pain.    [provider]  lisinopril (ZESTRIL) 20  MG tablet Take 20 mg by mouth daily.    [provider]  Menthol, Topical Analgesic, (BIOFREEZE) 4 % GEL Apply 1 Application topically as needed (pain).    [provider]  metoprolol tartrate (LOPRESSOR) 25 MG tablet Take 1 tablet (25 mg total) by mouth daily as needed (AFIB). 11/14/22   Kommor, Madison, MD  omeprazole (PRILOSEC) 40 MG capsule TAKE 1 CAPSULE BY MOUTH  DAILY 07/09/20   Salley Scarlet, MD    Family History Family History  Problem Relation Age of Onset   Multiple sclerosis Mother    COPD Mother    Diabetes Father    Cancer Maternal Grandfather         lung/liver   Heart disease Paternal Grandfather     Social History Social History   Tobacco Use   Smoking status: Never   Smokeless tobacco: Former    Types: Snuff    Quit date: 10/21/2022  Vaping Use   Vaping Use: Never used  Substance Use Topics   Alcohol use: Yes    Alcohol/week: 24.0 standard drinks of alcohol    Types: 24 Cans of beer per week   Drug use: No     Allergies   Codeine   Review of Systems Review of Systems Per HPI  Physical Exam Triage Vital Signs ED Triage Vitals  Enc Vitals Group     BP 11/26/22 0928 (!) 147/92     Pulse Rate 11/26/22 0928 78     Resp 11/26/22 0928 15     Temp 11/26/22 0928 98.8 F (37.1 C)     Temp Source 11/26/22 0928 Oral     SpO2 11/26/22 0928 94 %     Weight --      Height --      Head Circumference --      Peak Flow --      Pain Score 11/26/22 0931 6     Pain Loc --      Pain Edu? --      Excl. in GC? --    No data found.  Updated Vital Signs BP (!) 147/92 (BP Location: Right Arm)   Pulse 78   Temp 98.8 F (37.1 C) (Oral)   Resp 15   SpO2 94%   Visual Acuity Right Eye Distance:   Left Eye Distance:   Bilateral Distance:    Right Eye Near:   Left Eye Near:    Bilateral Near:     Physical Exam Vitals and nursing note reviewed.  Constitutional:      General: He is not in acute distress.    Appearance: Normal appearance.  HENT:     Head: Normocephalic.     Right Ear: Tympanic membrane, ear canal and external ear normal.     Nose: Nose normal.     Mouth/Throat:     Mouth: Mucous membranes are moist.     Pharynx: No posterior oropharyngeal erythema.  Eyes:     Extraocular Movements: Extraocular movements intact.     Pupils: Pupils are equal, round, and reactive to light.  Cardiovascular:     Rate and Rhythm: Normal rate and regular rhythm.     Pulses: Normal pulses.     Heart sounds: Normal heart sounds.  Pulmonary:     Effort: Pulmonary effort is normal. No respiratory distress.     Breath  sounds: Normal breath sounds. No stridor. No wheezing, rhonchi or rales.  Abdominal:     General: Bowel sounds are  normal.     Palpations: Abdomen is soft.     Tenderness: There is no abdominal tenderness.  Musculoskeletal:     Cervical back: Normal range of motion.  Lymphadenopathy:     Cervical: Cervical adenopathy present.  Skin:    General: Skin is warm and dry.     Findings: Rash present. Rash is vesicular.     Comments: Vesicular rash noted to the right lower lip there is no blistering, oozing, or drainage present.  The right side of the patient's scalp is without erythema, lesions, vesicles, or papules.  Neurological:     General: No focal deficit present.     Mental Status: He is alert and oriented to person, place, and time.  Psychiatric:        Mood and Affect: Mood normal.        Behavior: Behavior normal.      UC Treatments / Results  Labs (all labs ordered are listed, but only abnormal results are displayed) Labs Reviewed - No data to display  EKG   Radiology No results found.  Procedures Procedures (including critical care time)  Medications Ordered in UC Medications - No data to display  Initial Impression / Assessment and Plan / UC Course  I have reviewed the triage vital signs and the nursing notes.  Pertinent labs & imaging results that were available during my care of the patient were reviewed by me and considered in my medical decision making (see chart for details).  The patient is well-appearing, he is in no acute distress, vital signs are stable.  Suspect patient has shingles given his presentation.  Will treat with valacyclovir 1000 mg 3 times daily for the next 7 days.  Will also treat patient's inflammation and itchiness with prednisone 40 mg for the next 5 days.  For patient's neuropathic pain, will treat with gabapentin 100 mg 3 times daily.  Supportive care recommendations were provided and discussed with the patient to include use of  over-the-counter Tylenol while taking prednisone prednisone, he and as needed, cool compresses to the right face, and allowing for plenty of rest.  Discussed PHN with the patient, and advised that if symptoms, he will need to follow-up with his primary care physician for further evaluation.  I would like for the patient to follow-up with his primary care physician within the next 7 to 10 days for reevaluation.  The patient is in agreement with this plan of care and verbalizes.  All questions were answered.  Patient is for discharge.   Final Clinical Impressions(s) / UC Diagnoses   Final diagnoses:  Herpes zoster without complication     Discharge Instructions      Take medication as prescribed. Increase fluids and allow for plenty of rest. You are taking the prednisone, but over-the-counter Tylenol arthritis strength 650 mg tablets every 8 hours as needed for pain or discomfort.   May apply cool compresses to the right side of the face to help with pain or discomfort. As discussed, if the area on your face begins to abuse or drain, you will need to keep it covered to prevent spread. I would like you to follow-up with your primary care physician within the next 7 to 10 days. Follow-up as needed.      ED Prescriptions     Medication Sig Dispense Auth. Provider   valACYclovir (VALTREX) 1000 MG tablet Take 1 tablet (1,000 mg total) by mouth 3 (three) times daily for 7 days. 21 tablet Tura Roller-Warren, Lorene Dy  J, NP   predniSONE (DELTASONE) 20 MG tablet Take 2 tablets (40 mg total) by mouth daily with breakfast for 5 days. 10 tablet Dorion Petillo-Warren, Sadie Haber, NP   gabapentin (NEURONTIN) 100 MG capsule Take 1 capsule (100 mg total) by mouth 3 (three) times daily. 30 capsule Lional Icenogle-Warren, Sadie Haber, NP      PDMP not reviewed this encounter.   Abran Cantor, NP 11/26/22 1001

## 2022-11-26 NOTE — Discharge Instructions (Addendum)
Take medication as prescribed. Increase fluids and allow for plenty of rest. You are taking the prednisone, but over-the-counter Tylenol arthritis strength 650 mg tablets every 8 hours as needed for pain or discomfort.   May apply cool compresses to the right side of the face to help with pain or discomfort. As discussed, if the area on your face begins to abuse or drain, you will need to keep it covered to prevent spread. I would like you to follow-up with your primary care physician within the next 7 to 10 days. Follow-up as needed.

## 2022-11-26 NOTE — ED Triage Notes (Signed)
Pt c/o right sided Ear pain with swollen lymph nodes and rash x 5 days pt states it started out with ear ache on Friday carrying into Saturday then a headache started on Sunday morning, Monday the pain got worse then a small rash started on Tuesday along with the swollen lymph nodes. Pt's wife states that he had been complaining about the lymph node for about 2 weeks before the other sx's started. He has taken allegra and benadryl.

## 2023-01-01 ENCOUNTER — Ambulatory Visit (HOSPITAL_COMMUNITY)
Admission: RE | Admit: 2023-01-01 | Discharge: 2023-01-01 | Disposition: A | Discharge status: Home / Self Care | Payer: No Typology Code available for payment source | Source: Ambulatory Visit | Attending: Cardiology | Admitting: Cardiology

## 2023-01-01 DIAGNOSIS — I480 Paroxysmal atrial fibrillation: Secondary | ICD-10-CM | POA: Diagnosis present

## 2023-01-01 LAB — ECHOCARDIOGRAM COMPLETE
Area-P 1/2: 3.65 cm2
S' Lateral: 2.8 cm

## 2023-01-01 NOTE — Progress Notes (Signed)
*  PRELIMINARY RESULTS* Echocardiogram 2D Echocardiogram has been performed.  Stacey Drain 01/01/2023, 10:12 AM

## 2023-01-13 ENCOUNTER — Ambulatory Visit: Payer: No Typology Code available for payment source | Admitting: Internal Medicine

## 2023-05-27 ENCOUNTER — Encounter: Payer: Self-pay | Admitting: Cardiology

## 2023-05-27 ENCOUNTER — Ambulatory Visit: Payer: No Typology Code available for payment source | Attending: Cardiology | Admitting: Cardiology

## 2023-05-27 VITALS — BP 138/95 | HR 70 | Ht 73.0 in | Wt 261.0 lb

## 2023-05-27 DIAGNOSIS — I480 Paroxysmal atrial fibrillation: Secondary | ICD-10-CM | POA: Diagnosis not present

## 2023-05-27 DIAGNOSIS — I519 Heart disease, unspecified: Secondary | ICD-10-CM | POA: Diagnosis not present

## 2023-05-27 NOTE — Progress Notes (Signed)
Clinical Summary Raymond Hansen is a 52 y.o.male seen today as a new patient for the following medical problems.    1.PAF - consult note 12/2017 [facility] reports ER visit with rapid afib - converted back to SR with cardizem at that time - notes mention heavy EtOH use at the tmie.  - low chads2vasc score, not started on anticoag   11/14/22 ER [facility]  with afib and palpitations, SOB - self converted in ER.  - CHADS2Vasc 1 (HTN) not on anticoag  - no significant palpitations - has prn metoprolol, has not needed      2. LV systolic dysfunction - 2019 echo at [facility] LVEF 40-45% - 12/2022 echo: LVEF 60-65%   3. HTN - compliant with meds -  Upcoming labs with pcp this week   Works as IT trainer   Past Medical History:  Diagnosis Date   Dysrhythmia    hx of A Fib   GERD (gastroesophageal reflux disease)    Melanoma (HCC) 07/14/2013   3 lesions removed- [facility]     Allergies  Allergen Reactions   Codeine     Since childhood     Current Outpatient Medications  Medication Sig Dispense Refill   Ascorbic Acid (VITAMIN C) 500 MG CHEW Chew 500 mg by mouth daily.     cholecalciferol (VITAMIN D3) 25 MCG (1000 UNIT) tablet Take 1,000 Units by mouth daily.     fenofibrate 160 MG tablet Take 160 mg by mouth daily.     gabapentin (NEURONTIN) 100 MG capsule Take 1 capsule (100 mg total) by mouth 3 (three) times daily. 30 capsule 0   ibuprofen (ADVIL) 200 MG tablet Take 200 mg by mouth every 8 (eight) hours as needed for moderate pain.     lisinopril (ZESTRIL) 20 MG tablet Take 20 mg by mouth daily.     Menthol, Topical Analgesic, (BIOFREEZE) 4 % GEL Apply 1 Application topically as needed (pain).     metoprolol tartrate (LOPRESSOR) 25 MG tablet Take 1 tablet (25 mg total) by mouth daily as needed (AFIB). 30 tablet 1   omeprazole (PRILOSEC) 40 MG capsule TAKE 1 CAPSULE BY MOUTH  DAILY 90 capsule 0   No current facility-administered medications for this visit.      Past Surgical History:  Procedure Laterality Date   COLONOSCOPY WITH PROPOFOL N/A 02/27/2022   Procedure: COLONOSCOPY WITH PROPOFOL;  Surgeon: Lanelle Bal, DO;  Location: [facility];  Service: Endoscopy;  Laterality: N/A;  7:30am, asa 3   HERNIA REPAIR  1974   POLYPECTOMY  02/27/2022   Procedure: POLYPECTOMY;  Surgeon: Lanelle Bal, DO;  Location: [facility];  Service: Endoscopy;;   SHOULDER SURGERY     SHOULDER SURGERY Right 2004     Allergies  Allergen Reactions   Codeine     Since childhood      Family History  Problem Relation Age of Onset   Multiple sclerosis Mother    COPD Mother    Diabetes Father    Cancer Maternal Grandfather        lung/liver   Heart disease Paternal Grandfather      Social History Raymond Hansen reports that he has never smoked. He quit smokeless tobacco use about 7 months ago.  His smokeless tobacco use included snuff. Raymond Hansen reports current alcohol use of about 24.0 standard drinks of alcohol per week.   Review of Systems CONSTITUTIONAL: No weight loss, fever, chills, weakness or fatigue.  HEENT: Eyes: No visual loss, blurred vision, double vision or yellow sclerae.No hearing loss, sneezing, congestion, runny nose or sore throat.  SKIN: No rash or itching.  CARDIOVASCULAR: per hpi RESPIRATORY: No shortness of breath, cough or sputum.  GASTROINTESTINAL: No anorexia, nausea, vomiting or diarrhea. No abdominal pain or blood.  GENITOURINARY: No burning on urination, no polyuria NEUROLOGICAL: No headache, dizziness, syncope, paralysis, ataxia, numbness or tingling in the extremities. No change in bowel or bladder control.  MUSCULOSKELETAL: No muscle, back pain, joint pain or stiffness.  LYMPHATICS: No enlarged nodes. No history of splenectomy.  PSYCHIATRIC: No history of depression or anxiety.  ENDOCRINOLOGIC: No reports of sweating, cold or heat intolerance. No polyuria or polydipsia.  Marland Kitchen   Physical  Examination Today's Vitals   05/27/23 0804  BP: (!) 148/102  Pulse: 70  SpO2: 97%  Weight: 261 lb (118.4 kg)  Height: 6\' 1"  (1.854 m)   Body mass index is 34.43 kg/m.  Gen: resting comfortably, no acute distress HEENT: no scleral icterus, pupils equal round and reactive, no palptable cervical adenopathy,  CV: RRR, no m/rg, no jvd Resp: Clear to auscultation bilaterally GI: abdomen is soft, non-tender, non-distended, normal bowel sounds, no hepatosplenomegaly MSK: extremities are warm, no edema.  Skin: warm, no rash Neuro:  no focal deficits Psych: appropriate affect   Diagnostic Studies  2019 Echo [facility] INTERPRETATION  MILD LV SYSTOLIC DYSFUNCTION (See above)   WITH MILD LVH  NORMAL RIGHT VENTRICULAR SYSTOLIC FUNCTION  MILD VALVULAR REGURGITATION (See above)  NO VALVULAR STENOSIS  MILD MR, TR  EF 40-45%    12/2017 nuclear stress [facility] Normal overall myocardial perfusion scan no evidence of  stress-induced myocardial ischemia ejection fraction of 52% conclusion  negative scan    12/2022 echo  1. Left ventricular ejection fraction, by estimation, is 60 to 65%. The  left ventricle has normal function. The left ventricle has no regional  wall motion abnormalities. Left ventricular diastolic parameters are  consistent with Grade I diastolic  dysfunction (impaired relaxation).   2. Right ventricular systolic function is normal. The right ventricular  size is normal. Tricuspid regurgitation signal is inadequate for assessing  PA pressure.   3. The mitral valve is grossly normal. Trivial mitral valve  regurgitation.   4. The aortic valve is tricuspid. Aortic valve regurgitation is not  visualized. No aortic stenosis is present.   5. Aortic dilatation noted. There is borderline dilatation of the aortic  root, measuring 40 mm.   6. The inferior vena cava is normal in size with greater than 50%  respiratory variability, suggesting right atrial pressure of 3 mmHg.    Assessment and Plan  1.PAF - no significant symptoms, continue prn lopressor - CHADS2Vasc is 1 based on HTN, not on anticoag - continue to monitor   2. History of systolic dysfunction - 2019 echo at [facility] LVEF 40-45% - 6/20 24 echo LVEF had normalized at 60-65% - no symptoms - no further intervention indicated  3. HTN - elevated here. He will keep a bp log and take to his physical next week with pcp. Room to titrate lisinopril if needed   F/u 1year        Antoine Poche, M.D.

## 2023-05-27 NOTE — Patient Instructions (Signed)
Medication Instructions:  Your physician recommends that you continue on your current medications as directed. Please refer to the Current Medication list given to you today.  *If you need a refill on your cardiac medications before your next appointment, please call your pharmacy*   Lab Work: None If you have labs (blood work) drawn today and your tests are completely normal, you will receive your results only by: MyChart Message (if you have MyChart) OR A paper copy in the mail If you have any lab test that is abnormal or we need to change your treatment, we will call you to review the results.   Testing/Procedures: None   Follow-Up: At [facility], you and your health needs are our priority.  As part of our continuing mission to provide you with exceptional heart care, we have created designated Provider Care Teams.  These Care Teams include your primary Cardiologist (physician) and Advanced Practice Providers (APPs -  Physician Assistants and Nurse Practitioners) who all work together to provide you with the care you need, when you need it.  We recommend signing up for the patient portal called "MyChart".  Sign up information is provided on this After Visit Summary.  MyChart is used to connect with patients for Virtual Visits (Telemedicine).  Patients are able to view lab/test results, encounter notes, upcoming appointments, etc.  Non-urgent messages can be sent to your provider as well.   To learn more about what you can do with MyChart, go to [email or web address].    Your next appointment:   1 year(s)  Provider:   You may see Dina Rich, MD or one of the following Advanced Practice Providers on your designated Care Team:   Randall An, PA-C  Jacolyn Reedy, PA-C     Other Instructions Please keep a bp log and take it to your provider, Dr. Margo Aye, next week.

## 2023-12-02 ENCOUNTER — Other Ambulatory Visit (HOSPITAL_COMMUNITY): Payer: Self-pay | Admitting: Internal Medicine

## 2023-12-02 DIAGNOSIS — E782 Mixed hyperlipidemia: Secondary | ICD-10-CM

## 2024-01-08 ENCOUNTER — Ambulatory Visit (HOSPITAL_COMMUNITY)
Admission: RE | Admit: 2024-01-08 | Discharge: 2024-01-08 | Disposition: A | Discharge status: Home / Self Care | Payer: Self-pay | Source: Ambulatory Visit | Attending: Internal Medicine | Admitting: Internal Medicine

## 2024-01-08 DIAGNOSIS — E782 Mixed hyperlipidemia: Secondary | ICD-10-CM | POA: Insufficient documentation

## 2024-09-06 ENCOUNTER — Ambulatory Visit: Admitting: Physician Assistant
